# Patient Record
Sex: Male | Born: 1970 | Race: White | Hispanic: No | Marital: Married | Smoking: Former smoker
Health system: Southern US, Community
[De-identification: ages and names within clinical notes are randomized; demographics above are authoritative.]

## PROBLEM LIST (undated history)

## (undated) DIAGNOSIS — K219 Gastro-esophageal reflux disease without esophagitis: Secondary | ICD-10-CM

## (undated) DIAGNOSIS — N200 Calculus of kidney: Secondary | ICD-10-CM

## (undated) DIAGNOSIS — R7301 Impaired fasting glucose: Secondary | ICD-10-CM

## (undated) DIAGNOSIS — E66812 Obesity, class 2: Secondary | ICD-10-CM

## (undated) DIAGNOSIS — D485 Neoplasm of uncertain behavior of skin: Secondary | ICD-10-CM

## (undated) DIAGNOSIS — E669 Obesity, unspecified: Secondary | ICD-10-CM

## (undated) HISTORY — DX: Impaired fasting glucose: R73.01

## (undated) HISTORY — DX: Obesity, class 2: E66.812

## (undated) HISTORY — DX: Calculus of kidney: N20.0

## (undated) HISTORY — PX: TONSILLECTOMY: SHX5217

## (undated) HISTORY — DX: Gastro-esophageal reflux disease without esophagitis: K21.9

## (undated) HISTORY — DX: Obesity, unspecified: E66.9

## (undated) HISTORY — DX: Neoplasm of uncertain behavior of skin: D48.5

---

## 2007-02-09 ENCOUNTER — Ambulatory Visit: Payer: Self-pay | Admitting: Internal Medicine

## 2007-02-09 LAB — CONVERTED CEMR LAB
ALT: 36 units/L (ref 0–53)
AST: 21 units/L (ref 0–37)
Albumin: 4.2 g/dL (ref 3.5–5.2)
Alkaline Phosphatase: 46 units/L (ref 39–117)
BUN: 16 mg/dL (ref 6–23)
Basophils Absolute: 0.1 10*3/uL (ref 0.0–0.1)
Basophils Relative: 1.4 % — ABNORMAL HIGH (ref 0.0–1.0)
Bilirubin Urine: NEGATIVE
Bilirubin, Direct: 0.2 mg/dL (ref 0.0–0.3)
Blood in Urine, dipstick: NEGATIVE
CO2: 30 meq/L (ref 19–32)
Calcium: 9.2 mg/dL (ref 8.4–10.5)
Chloride: 104 meq/L (ref 96–112)
Cholesterol: 159 mg/dL (ref 0–200)
Creatinine, Ser: 0.8 mg/dL (ref 0.4–1.5)
Eosinophils Absolute: 0.3 10*3/uL (ref 0.0–0.6)
Eosinophils Relative: 4.5 % (ref 0.0–5.0)
GFR calc Af Amer: 139 mL/min
GFR calc non Af Amer: 115 mL/min
Glucose, Bld: 102 mg/dL — ABNORMAL HIGH (ref 70–99)
Glucose, Urine, Semiquant: NEGATIVE
HCT: 43.7 % (ref 39.0–52.0)
HDL: 51.6 mg/dL (ref >39.0)
Hemoglobin: 15.2 g/dL (ref 13.0–17.0)
Ketones, urine, test strip: NEGATIVE
LDL Cholesterol: 96 mg/dL (ref 0–99)
Lymphocytes Relative: 30.6 % (ref 12.0–46.0)
MCHC: 34.8 g/dL (ref 30.0–36.0)
MCV: 93 fL (ref 78.0–100.0)
Monocytes Absolute: 0.6 10*3/uL (ref 0.2–0.7)
Monocytes Relative: 7.8 % (ref 3.0–11.0)
Neutro Abs: 3.9 10*3/uL (ref 1.4–7.7)
Neutrophils Relative %: 55.7 % (ref 43.0–77.0)
Nitrite: NEGATIVE
Platelets: 271 10*3/uL (ref 150–400)
Potassium: 4.9 meq/L (ref 3.5–5.1)
Protein, U semiquant: NEGATIVE
RBC: 4.7 M/uL (ref 4.22–5.81)
RDW: 12.5 % (ref 11.5–14.6)
Sodium: 140 meq/L (ref 135–145)
Specific Gravity, Urine: 1.015
TSH: 4.52 microintl units/mL (ref 0.35–5.50)
Total Bilirubin: 0.8 mg/dL (ref 0.3–1.2)
Total CHOL/HDL Ratio: 3.1
Total Protein: 6.8 g/dL (ref 6.0–8.3)
Triglycerides: 59 mg/dL (ref 0–149)
Urobilinogen, UA: 0.2
VLDL: 12 mg/dL (ref 0–40)
WBC Urine, dipstick: NEGATIVE
WBC: 7.1 10*3/uL (ref 4.5–10.5)
pH: 6.5

## 2007-02-21 ENCOUNTER — Ambulatory Visit: Payer: Self-pay | Admitting: Internal Medicine

## 2007-03-30 ENCOUNTER — Ambulatory Visit: Payer: Self-pay | Admitting: Internal Medicine

## 2007-03-30 DIAGNOSIS — D485 Neoplasm of uncertain behavior of skin: Secondary | ICD-10-CM

## 2007-03-30 HISTORY — DX: Neoplasm of uncertain behavior of skin: D48.5

## 2007-04-06 ENCOUNTER — Telehealth: Payer: Self-pay | Admitting: Internal Medicine

## 2007-04-15 ENCOUNTER — Telehealth: Payer: Self-pay | Admitting: Internal Medicine

## 2008-05-03 ENCOUNTER — Ambulatory Visit: Payer: Self-pay | Admitting: Family Medicine

## 2008-05-03 DIAGNOSIS — R233 Spontaneous ecchymoses: Secondary | ICD-10-CM

## 2008-05-03 DIAGNOSIS — H1130 Conjunctival hemorrhage, unspecified eye: Secondary | ICD-10-CM

## 2008-10-16 ENCOUNTER — Telehealth: Payer: Self-pay | Admitting: Internal Medicine

## 2009-01-07 ENCOUNTER — Ambulatory Visit: Payer: Self-pay | Admitting: Internal Medicine

## 2009-01-07 LAB — CONVERTED CEMR LAB
ALT: 22 units/L (ref 0–53)
AST: 19 units/L (ref 0–37)
Albumin: 4.3 g/dL (ref 3.5–5.2)
Bilirubin Urine: NEGATIVE
Chloride: 107 meq/L (ref 96–112)
Cholesterol: 139 mg/dL (ref 0–200)
Eosinophils Relative: 3.5 % (ref 0.0–5.0)
GFR calc non Af Amer: 113.69 mL/min (ref >60)
Glucose, Bld: 109 mg/dL — ABNORMAL HIGH (ref 70–99)
Glucose, Urine, Semiquant: NEGATIVE
HCT: 41.8 % (ref 39.0–52.0)
Hemoglobin: 14.2 g/dL (ref 13.0–17.0)
Lymphs Abs: 1.7 10*3/uL (ref 0.7–4.0)
MCV: 96.7 fL (ref 78.0–100.0)
Monocytes Absolute: 0.5 10*3/uL (ref 0.1–1.0)
Monocytes Relative: 8.2 % (ref 3.0–12.0)
Neutro Abs: 3.3 10*3/uL (ref 1.4–7.7)
Potassium: 4.3 meq/L (ref 3.5–5.1)
Protein, U semiquant: NEGATIVE
RDW: 12.7 % (ref 11.5–14.6)
Sodium: 142 meq/L (ref 135–145)
TSH: 2.96 microintl units/mL (ref 0.35–5.50)
VLDL: 18.2 mg/dL (ref 0.0–40.0)
WBC Urine, dipstick: NEGATIVE
WBC: 5.7 10*3/uL (ref 4.5–10.5)
pH: 5.5

## 2009-01-14 ENCOUNTER — Ambulatory Visit: Payer: Self-pay | Admitting: Internal Medicine

## 2009-11-01 ENCOUNTER — Emergency Department (HOSPITAL_COMMUNITY): Admission: EM | Admit: 2009-11-01 | Discharge: 2009-11-01 | Payer: Self-pay | Admitting: Emergency Medicine

## 2009-11-02 ENCOUNTER — Emergency Department (HOSPITAL_COMMUNITY): Admission: EM | Admit: 2009-11-02 | Discharge: 2009-11-02 | Payer: Self-pay | Admitting: Emergency Medicine

## 2010-05-01 LAB — URINALYSIS, ROUTINE W REFLEX MICROSCOPIC
Bilirubin Urine: NEGATIVE
Nitrite: NEGATIVE
Specific Gravity, Urine: 1.031 — ABNORMAL HIGH (ref 1.005–1.030)
Urobilinogen, UA: 0.2 mg/dL (ref 0.0–1.0)

## 2010-05-01 LAB — DIFFERENTIAL
Basophils Absolute: 0 10*3/uL (ref 0.0–0.1)
Basophils Relative: 1 % (ref 0–1)
Neutro Abs: 4.3 10*3/uL (ref 1.7–7.7)
Neutrophils Relative %: 62 % (ref 43–77)

## 2010-05-01 LAB — POCT I-STAT, CHEM 8
Calcium, Ion: 1.19 mmol/L (ref 1.12–1.32)
Chloride: 104 mEq/L (ref 96–112)
Glucose, Bld: 124 mg/dL — ABNORMAL HIGH (ref 70–99)
HCT: 45 % (ref 39.0–52.0)

## 2010-05-01 LAB — CBC
MCHC: 34.8 g/dL (ref 30.0–36.0)
Platelets: 272 10*3/uL (ref 150–400)
RDW: 13.3 % (ref 11.5–15.5)

## 2010-05-01 LAB — URINE MICROSCOPIC-ADD ON

## 2010-05-13 ENCOUNTER — Emergency Department (HOSPITAL_BASED_OUTPATIENT_CLINIC_OR_DEPARTMENT_OTHER): Admission: EM | Admit: 2010-05-13 | Payer: Self-pay | Source: Home / Self Care

## 2011-02-17 DIAGNOSIS — K219 Gastro-esophageal reflux disease without esophagitis: Secondary | ICD-10-CM

## 2011-02-17 HISTORY — DX: Gastro-esophageal reflux disease without esophagitis: K21.9

## 2011-03-05 ENCOUNTER — Encounter: Payer: Self-pay | Admitting: Internal Medicine

## 2011-03-06 ENCOUNTER — Other Ambulatory Visit: Payer: Self-pay

## 2011-03-13 ENCOUNTER — Encounter: Payer: Self-pay | Admitting: Internal Medicine

## 2011-08-21 ENCOUNTER — Ambulatory Visit (INDEPENDENT_AMBULATORY_CARE_PROVIDER_SITE_OTHER): Payer: Managed Care, Other (non HMO) | Admitting: Family Medicine

## 2011-08-21 ENCOUNTER — Encounter: Payer: Self-pay | Admitting: Family Medicine

## 2011-08-21 VITALS — BP 131/87 | HR 61 | Temp 97.2°F | Ht 67.0 in | Wt 247.0 lb

## 2011-08-21 DIAGNOSIS — Z0000 Encounter for general adult medical examination without abnormal findings: Secondary | ICD-10-CM

## 2011-08-21 DIAGNOSIS — L909 Atrophic disorder of skin, unspecified: Secondary | ICD-10-CM

## 2011-08-21 DIAGNOSIS — L918 Other hypertrophic disorders of the skin: Secondary | ICD-10-CM

## 2011-08-21 DIAGNOSIS — L919 Hypertrophic disorder of the skin, unspecified: Secondary | ICD-10-CM

## 2011-08-21 LAB — COMPREHENSIVE METABOLIC PANEL
Albumin: 4.4 g/dL (ref 3.5–5.2)
CO2: 26 mEq/L (ref 19–32)
Chloride: 105 mEq/L (ref 96–112)
GFR: 130.94 mL/min (ref >60.00)
Glucose, Bld: 102 mg/dL — ABNORMAL HIGH (ref 70–99)
Potassium: 4.5 mEq/L (ref 3.5–5.1)
Sodium: 138 mEq/L (ref 135–145)
Total Protein: 7.2 g/dL (ref 6.0–8.3)

## 2011-08-21 LAB — CBC WITH DIFFERENTIAL/PLATELET
Basophils Absolute: 0 10*3/uL (ref 0.0–0.1)
Eosinophils Absolute: 0.1 10*3/uL (ref 0.0–0.7)
HCT: 43.2 % (ref 39.0–52.0)
Lymphs Abs: 1.3 10*3/uL (ref 0.7–4.0)
MCV: 94.2 fl (ref 78.0–100.0)
Monocytes Absolute: 0.4 10*3/uL (ref 0.1–1.0)
Monocytes Relative: 7.2 % (ref 3.0–12.0)
Platelets: 255 10*3/uL (ref 150.0–400.0)
RDW: 13.7 % (ref 11.5–14.6)

## 2011-08-21 LAB — TSH: TSH: 2.65 u[IU]/mL (ref 0.35–5.50)

## 2011-08-21 NOTE — Assessment & Plan Note (Signed)
Reviewed age and gender appropriate health maintenance issues (prudent diet, regular exercise, health risks of tobacco and excessive alcohol, use of seatbelts, fire alarms in home, use of sunscreen).  Also reviewed age and gender appropriate health screening as well as vaccine recommendations. Fasting labs done today.

## 2011-08-21 NOTE — Progress Notes (Signed)
Office Note 08/21/2011  CC:  Chief Complaint  Patient presents with  . Establish Care    CPE, fasting labs    HPI:  A[place] is a 41 y.o. White male who is here to transfer care from [facility], also get CPE with fasting labs. Patient's most recent primary MD: [facility] records in [facility] were reviewed prior to or during today's visit.  Says it is past time for CPE so he's here to get this done.  No complaints.   Past Medical History  Diagnosis Date  . 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 Obesity   . Impaired glucose tolerance   . Nephrolithiasis   . GERD (gastroesophageal reflux disease) 2013    Lanzoprazole helpful    Past Surgical History  Procedure Date  . Tonsillectomy     Family History  Problem Relation Age of Onset  . Diabetes Father     History   Social History  . Marital Status: Married    Spouse Name: N/A    Number of Children: N/A  . Years of Education: N/A   Occupational History  . Not on file.   Social History Main Topics  . Smoking status: Former Smoker    Types: Cigarettes    Quit date: 02/16/1986  . Smokeless tobacco: Current User    Types: Snuff, Chew  . Alcohol Use: Yes  . Drug Use: No  . Sexually Active: Not on file   Other Topics Concern  . 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  Occ alcohol intake.  No hx of drugs or alcohol problems.    Outpatient Encounter Prescriptions as of 08/21/2011  Medication Sig Dispense Refill  . lansoprazole (PREVACID SOLUTAB) 15 MG disintegrating tablet Take 15 mg by mouth daily.        No Known Allergies  ROS Review of Systems  Constitutional: Negative for fever, chills, appetite change and fatigue.  HENT:  Negative for ear pain, congestion, sore throat, neck stiffness and dental problem.   Eyes: Negative for discharge, redness and visual disturbance.  Respiratory: Negative for cough, chest tightness, shortness of breath and wheezing.   Cardiovascular: Negative for chest pain, palpitations and leg swelling.  Gastrointestinal: Negative for nausea, vomiting, abdominal pain, diarrhea and blood in stool.  Genitourinary: Negative for dysuria, urgency, frequency, hematuria, flank pain and difficulty urinating.  Musculoskeletal: Negative for myalgias, back pain, joint swelling and arthralgias.  Skin: Negative for pallor and rash.       Left shoulder/side of neck intersection--irritated skin tag that has been present x 30 yrs, has not bothered him until just recently after getting clothes snagged on it repeatedly.  Neurological: Negative for dizziness, speech difficulty, weakness and headaches.  Hematological: Negative for adenopathy. Does not bruise/bleed easily.  Psychiatric/Behavioral: Negative for confusion and disturbed wake/sleep cycle. The patient is not nervous/anxious.     PE; Blood pressure 131/87, pulse 61, temperature 97.2 F (36.2 C), temperature source Temporal, height 5\' 7"  (1.702 m), weight 247 lb (112.038 kg), SpO2 98.00%. Gen: Alert, well appearing.  Patient is oriented to person, place, time, and situation. Affect: pleasant, thought and speech are lucid. ENT: Ears: EACs clear, normal epithelium.  TMs with good light reflex and landmarks bilaterally.  Eyes:  no injection, icteris, swelling, or exudate.  EOMI, PERRLA. Nose: no drainage or turbinate edema/swelling.  No injection or focal lesion.  Mouth: lips without lesion/swelling.  Oral mucosa pink and moist.  Dentition intact and without obvious caries or gingival swelling.  Oropharynx without erythema, exudate, or swelling.  Neck: supple/nontender.  No LAD, mass, or TM.  Carotid pulses 2+ bilaterally, without bruits. CV: RRR, no m/r/g.    LUNGS: CTA bilat, nonlabored resps, good aeration in all lung fields. ABD: soft, NT, ND, BS normal.  No hepatospenomegaly or mass.  No bruits. EXT: no clubbing, cyanosis, or edema.  Skin - no sores or rashes or color changes.  He has a pedunculated 2-33mm flesh colored lesion at the intersection of left side of neck and left shoulder--appears c/w a skin tag. Musculoskeletal: no joint swelling, erythema, warmth, or tenderness.  ROM of all joints intact. Neuro: CN 2-12 intact bilaterally, strength 5/5 in proximal and distal upper extremities and lower extremities bilaterally.  No sensory deficits.  No tremor.  No disdiadochokinesis.  No ataxia.  Upper extremity and lower extremity DTRs symmetric.  No pronator drift. Genitals normal; both testes normal without tenderness, masses, hydroceles, varicoceles, erythema or swelling. Shaft normal, circumcised, meatus normal without discharge. No inguinal hernia noted. No inguinal lymphadenopathy.  Pertinent labs:  None today  Procedure: skin tag excision.  After prepping skin with betadine, I used a #10 scalpel and pickups to grasp and cut the skin tag at its base.  Minimal bleeding, pressure with gauze was sufficient to control this.  Dressing applied, wound care reviewed.  Pt tolerated procedure well, no immediate complications. Specimen sent to pathology.  ASSESSMENT AND PLAN:   Transfer patient:  Health maintenance examination Reviewed age and gender appropriate health maintenance issues (prudent diet, regular exercise, health risks of tobacco and excessive alcohol, use of seatbelts, fire alarms in home, use of sunscreen).  Also reviewed age and gender appropriate health screening as well as vaccine recommendations. Fasting labs done today.  Inflamed acrochordon Excised today, sent to pathology.    Return in about 1 year (around 08/20/2012) for CPE.

## 2011-08-21 NOTE — Assessment & Plan Note (Addendum)
Excised today, sent to pathology.

## 2011-08-21 NOTE — Patient Instructions (Signed)
Health Maintenance, Males A healthy lifestyle and preventative care can promote health and wellness.  Maintain regular health, dental, and eye exams.   Eat a healthy diet. Foods like vegetables, fruits, whole grains, low-fat dairy products, and lean protein foods contain the nutrients you need without too many calories. Decrease your intake of foods high in solid fats, added sugars, and salt. Get information about a proper diet from your caregiver, if necessary.   Regular physical exercise is one of the most important things you can do for your health. Most adults should get at least 150 minutes of moderate-intensity exercise (any activity that increases your heart rate and causes you to sweat) each week. In addition, most adults need muscle-strengthening exercises on 2 or more days a week.    Maintain a healthy weight. The body mass index (BMI) is a screening tool to identify possible weight problems. It provides an estimate of body fat based on height and weight. Your caregiver can help determine your BMI, and can help you achieve or maintain a healthy weight. For adults 20 years and older:   A BMI below 18.5 is considered underweight.   A BMI of 18.5 to 24.9 is normal.   A BMI of 25 to 29.9 is considered overweight.   A BMI of 30 and above is considered obese.   Maintain normal blood lipids and cholesterol by exercising and minimizing your intake of saturated fat. Eat a balanced diet with plenty of fruits and vegetables. Blood tests for lipids and cholesterol should begin at age 20 and be repeated every 5 years. If your lipid or cholesterol levels are high, you are over 50, or you are a high risk for heart disease, you may need your cholesterol levels checked more frequently.Ongoing high lipid and cholesterol levels should be treated with medicines, if diet and exercise are not effective.   If you smoke, find out from your caregiver how to quit. If you do not use tobacco, do not start.    If you choose to drink alcohol, do not exceed 2 drinks per day. One drink is considered to be 12 ounces (355 mL) of beer, 5 ounces (148 mL) of wine, or 1.5 ounces (44 mL) of liquor.   Avoid use of street drugs. Do not share needles with anyone. Ask for help if you need support or instructions about stopping the use of drugs.   High blood pressure causes heart disease and increases the risk of stroke. Blood pressure should be checked at least every 1 to 2 years. Ongoing high blood pressure should be treated with medicines if weight loss and exercise are not effective.   If you are 45 to 43 years old, ask your caregiver if you should take aspirin to prevent heart disease.   Diabetes screening involves taking a blood sample to check your fasting blood sugar level. This should be done once every 3 years, after age 45, if you are within normal weight and without risk factors for diabetes. Testing should be considered at a younger age or be carried out more frequently if you are overweight and have at least 1 risk factor for diabetes.   Colorectal cancer can be detected and often prevented. Most routine colorectal cancer screening begins at the age of 50 and continues through age 75. However, your caregiver may recommend screening at an earlier age if you have risk factors for colon cancer. On a yearly basis, your caregiver may provide home test kits to check for hidden   blood in the stool. Use of a small camera at the end of a tube, to directly examine the colon (sigmoidoscopy or colonoscopy), can detect the earliest forms of colorectal cancer. Talk to your caregiver about this at age 50, when routine screening begins. Direct examination of the colon should be repeated every 5 to 10 years through age 75, unless early forms of pre-cancerous polyps or small growths are found.   Hepatitis C blood testing is recommended for all people born from 1945 through 1965 and any individual with known risks for  hepatitis C.   Healthy men should no longer receive prostate-specific antigen (PSA) blood tests as part of routine cancer screening. Consult with your caregiver about prostate cancer screening.   Testicular cancer screening is not recommended for adolescents or adult males who have no symptoms. Screening includes self-exam, caregiver exam, and other screening tests. Consult with your caregiver about any symptoms you have or any concerns you have about testicular cancer.   Practice safe sex. Use condoms and avoid high-risk sexual practices to reduce the spread of sexually transmitted infections (STIs).   Use sunscreen with a sun protection factor (SPF) of 30 or greater. Apply sunscreen liberally and repeatedly throughout the day. You should seek shade when your shadow is shorter than you. Protect yourself by wearing long sleeves, pants, a wide-brimmed hat, and sunglasses year round, whenever you are outdoors.   Notify your caregiver of new moles or changes in moles, especially if there is a change in shape or color. Also notify your caregiver if a mole is larger than the size of a pencil eraser.   A one-time screening for abdominal aortic aneurysm (AAA) and surgical repair of large AAAs by sound wave imaging (ultrasonography) is recommended for ages 65 to 75 years who are current or former smokers.   Stay current with your immunizations.  Document Released: 08/01/2007 Document Revised: 01/22/2011 Document Reviewed: 06/30/2010 [employer] Patient Information 2012 [employer]. 

## 2011-08-24 ENCOUNTER — Other Ambulatory Visit (HOSPITAL_COMMUNITY)
Admission: RE | Admit: 2011-08-24 | Discharge: 2011-08-24 | Disposition: A | Discharge status: Home / Self Care | Payer: Managed Care, Other (non HMO) | Source: Ambulatory Visit | Attending: Family Medicine | Admitting: Family Medicine

## 2011-08-24 DIAGNOSIS — L909 Atrophic disorder of skin, unspecified: Secondary | ICD-10-CM | POA: Insufficient documentation

## 2011-08-24 NOTE — Addendum Note (Signed)
Addended by: Luisa Dago on: 08/24/2011 08:40 AM   Modules accepted: Orders

## 2012-01-11 IMAGING — CT CT ABD-PELV W/O CM
2 of 4 series · 17 of 45 positions shown, 19 images · non-contrast
Comparison: None

CLINICAL DATA: Left flank pain, nausea, microscopic hematuria

CT ABDOMEN AND PELVIS WITHOUT CONTRAST
TECHNIQUE: Multidetector CT imaging of the abdomen and pelvis was
performed following the standard protocol without intravenous
contrast.

[Series 5: lung windows · axial · 0.74mm/px · z∈[+1203,+1283]mm · 14 of 19 slices shown, 16 images]
[im 2/19  soft-tissue]
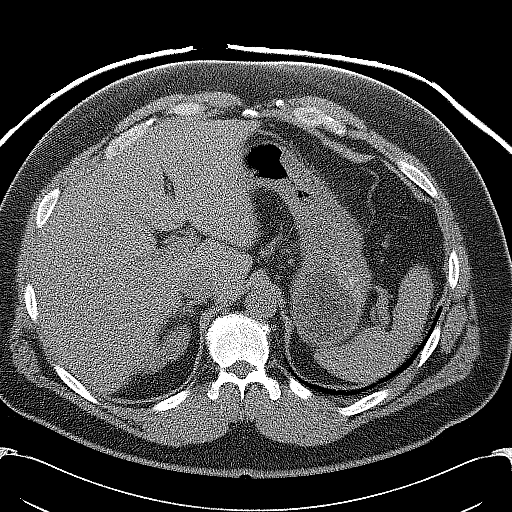
[im 2/19  bone]
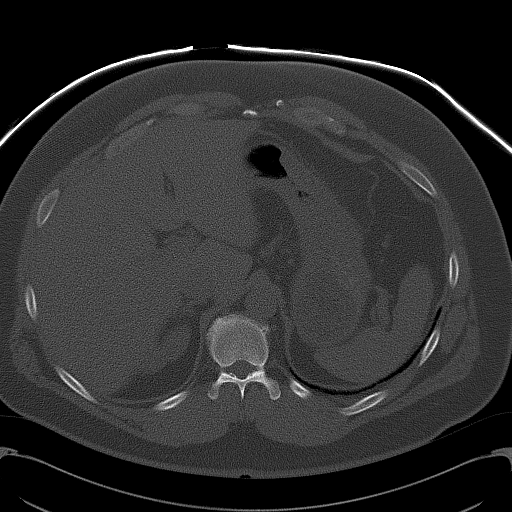
[im 3/19  soft-tissue]
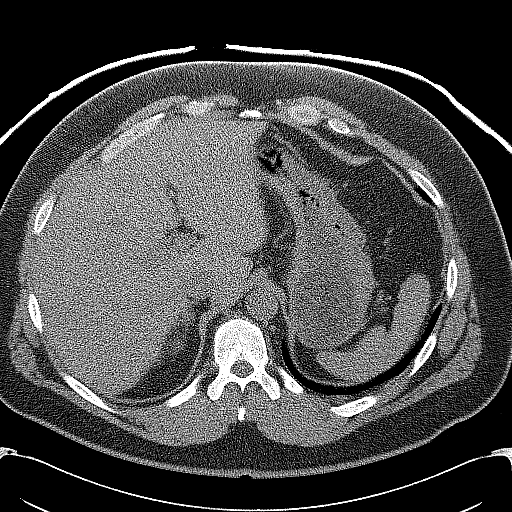
[im 5/19  soft-tissue]
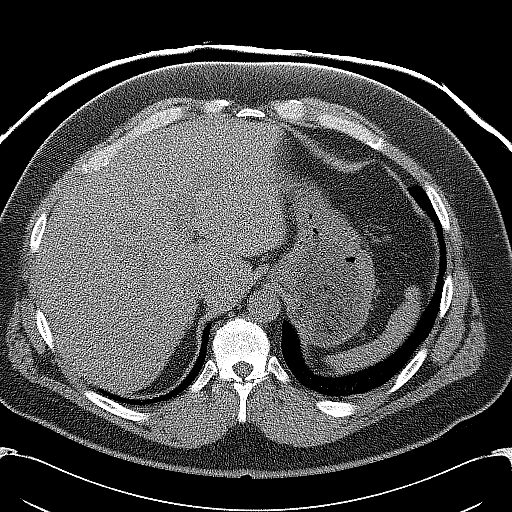
[im 6/19  soft-tissue]
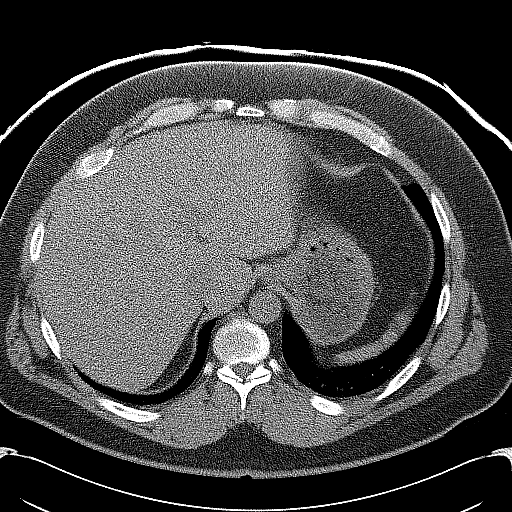
[im 7/19  soft-tissue]
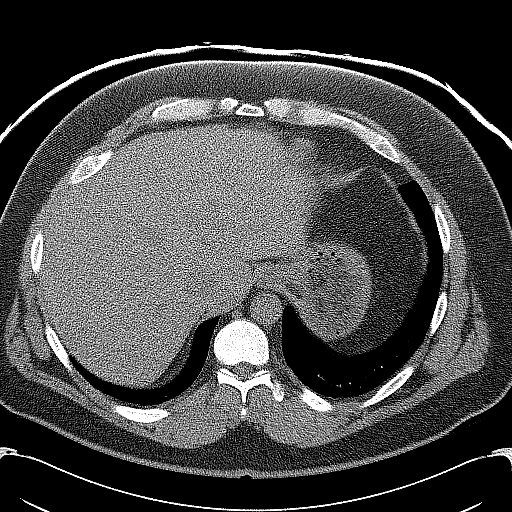
[im 8/19  soft-tissue]
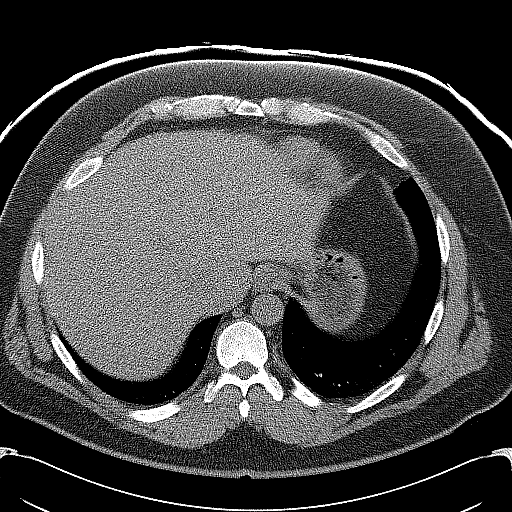
[im 9/19  soft-tissue]
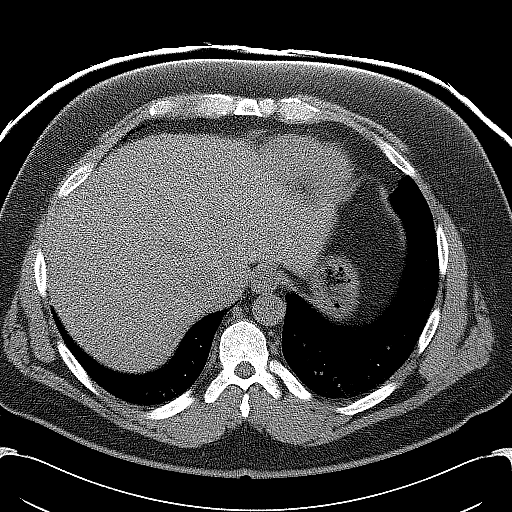
[im 11/19  soft-tissue]
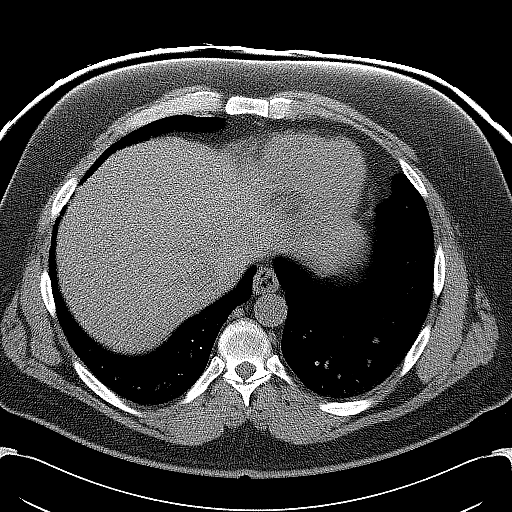
[im 12/19  soft-tissue]
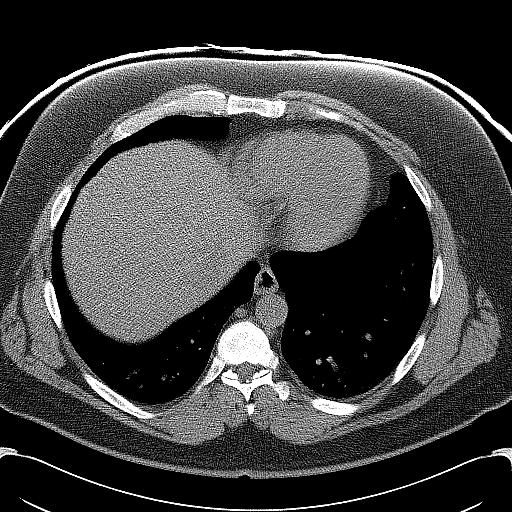
[im 12/19  bone]
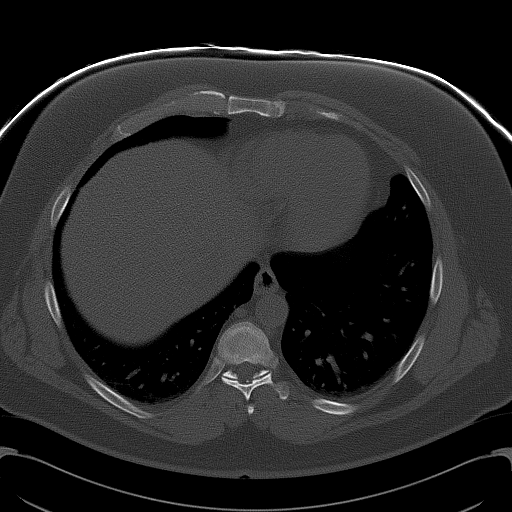
[im 13/19  soft-tissue]
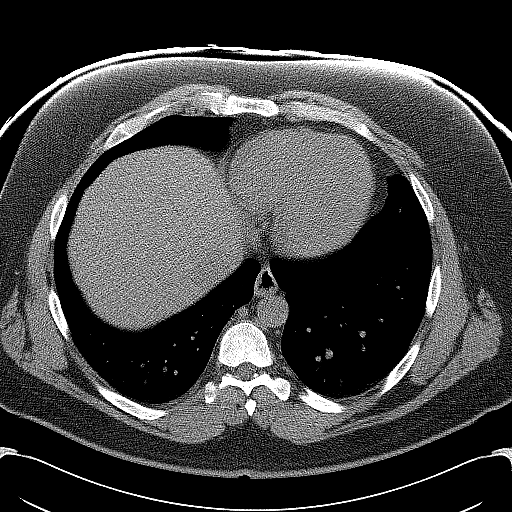
[im 14/19  soft-tissue]
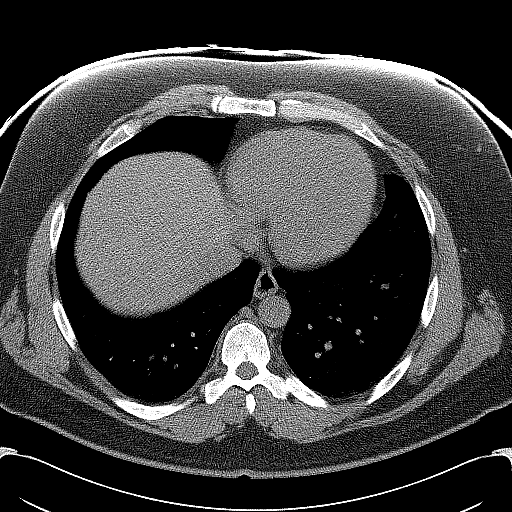
[im 15/19  soft-tissue]
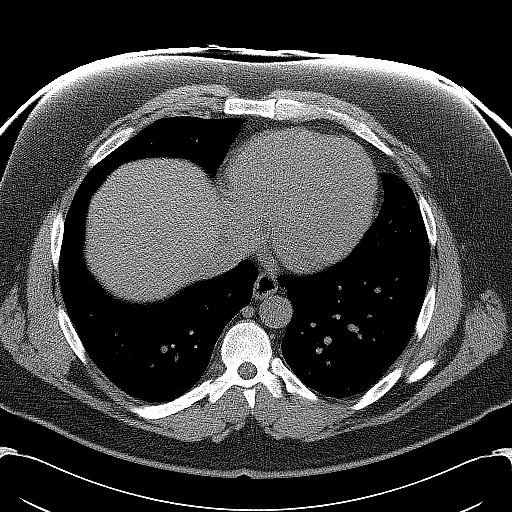
[im 17/19  soft-tissue]
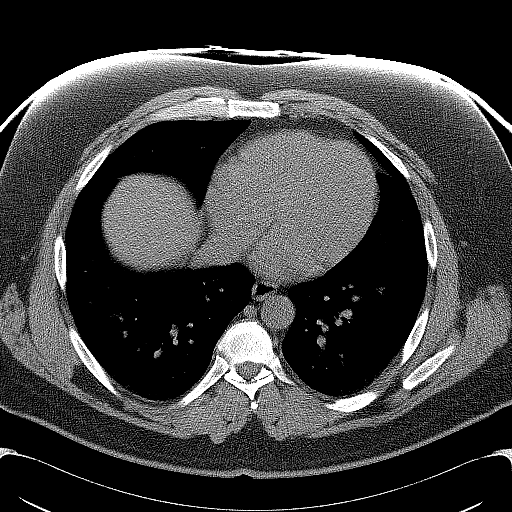
[im 18/19  soft-tissue]
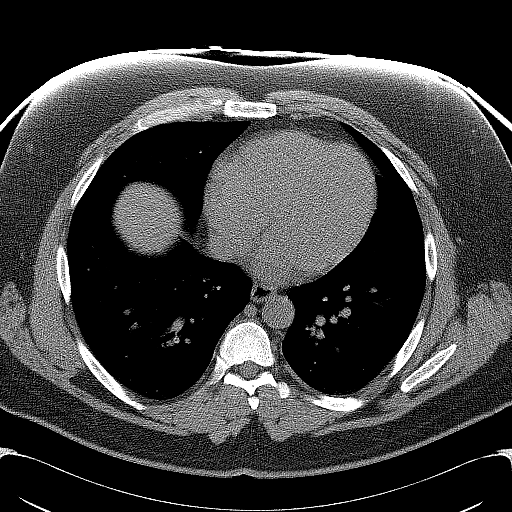

[Series 602: <mpr thick range> · coronal · 0.94mm/px · 3 of 84 slices shown]
[im 28/84  soft-tissue]
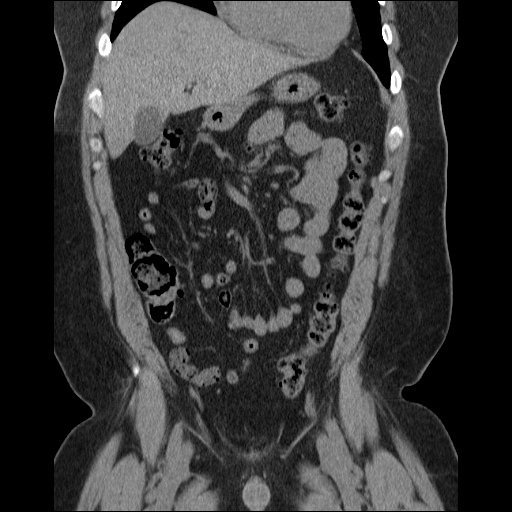
[im 37/84  soft-tissue]
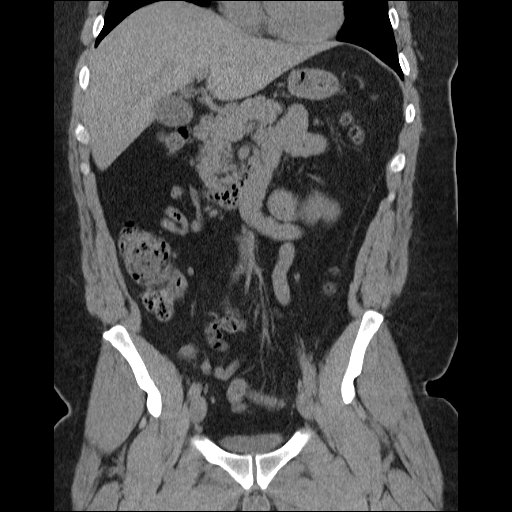
[im 47/84  soft-tissue]
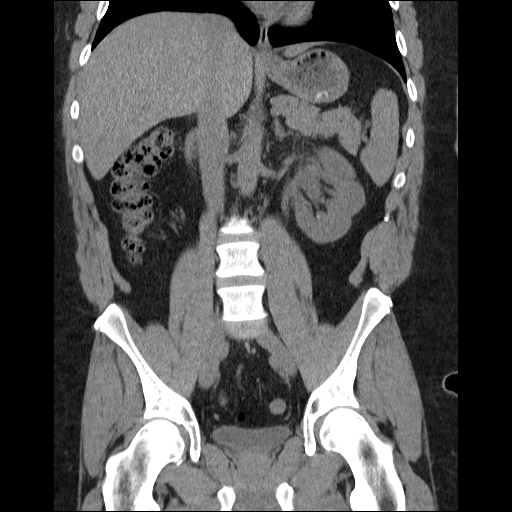

[17 of 45 positions shown; findings below may reference images not displayed]

FINDINGS: Dependent atelectasis at lung bases.
Left hydronephrosis and mild enlargement of left kidney secondary
to a proximal left ureteral calculus 4 mm diameter image 45.
No additional urinary tract calcification or distal ureteral
dilatation.
Liver, spleen, pancreas, right kidney, and adrenal glands
unremarkable.
Normal appendix.
Stomach and bowel loops unremarkable.
No mass, adenopathy, free fluid or inflammatory process.
No acute osseous findings.
IMPRESSION: Left hydronephrosis secondary to a 4 mm diameter proximal left
ureteral calculus.

## 2012-04-02 ENCOUNTER — Other Ambulatory Visit: Payer: Self-pay

## 2012-12-22 ENCOUNTER — Other Ambulatory Visit: Payer: Self-pay

## 2013-02-06 ENCOUNTER — Ambulatory Visit (INDEPENDENT_AMBULATORY_CARE_PROVIDER_SITE_OTHER): Payer: Managed Care, Other (non HMO) | Admitting: Family Medicine

## 2013-02-06 ENCOUNTER — Encounter: Payer: Self-pay | Admitting: Family Medicine

## 2013-02-06 VITALS — BP 121/80 | HR 57 | Temp 98.4°F | Ht 67.0 in | Wt 244.8 lb

## 2013-02-06 DIAGNOSIS — Z0000 Encounter for general adult medical examination without abnormal findings: Secondary | ICD-10-CM

## 2013-02-06 LAB — CBC WITH DIFFERENTIAL/PLATELET
Basophils Relative: 0.5 % (ref 0.0–3.0)
Eosinophils Absolute: 0.1 10*3/uL (ref 0.0–0.7)
Eosinophils Relative: 2.9 % (ref 0.0–5.0)
Hemoglobin: 14 g/dL (ref 13.0–17.0)
Lymphocytes Relative: 28.6 % (ref 12.0–46.0)
MCHC: 33.3 g/dL (ref 30.0–36.0)
MCV: 93 fl (ref 78.0–100.0)
Monocytes Absolute: 0.4 10*3/uL (ref 0.1–1.0)
Neutro Abs: 3.2 10*3/uL (ref 1.4–7.7)
RBC: 4.51 Mil/uL (ref 4.22–5.81)
WBC: 5.2 10*3/uL (ref 4.5–10.5)

## 2013-02-06 LAB — COMPREHENSIVE METABOLIC PANEL
Albumin: 4.4 g/dL (ref 3.5–5.2)
Alkaline Phosphatase: 36 U/L — ABNORMAL LOW (ref 39–117)
BUN: 19 mg/dL (ref 6–23)
CO2: 27 mEq/L (ref 19–32)
Calcium: 8.8 mg/dL (ref 8.4–10.5)
GFR: 146.87 mL/min (ref >60.00)
Glucose, Bld: 101 mg/dL — ABNORMAL HIGH (ref 70–99)
Potassium: 4.8 mEq/L (ref 3.5–5.1)

## 2013-02-06 LAB — LIPID PANEL: Cholesterol: 136 mg/dL (ref 0–200)

## 2013-02-06 NOTE — Progress Notes (Signed)
Pre-visit discussion using our clinic review tool. No additional management support is needed unless otherwise documented below in the visit note.  

## 2013-02-06 NOTE — Progress Notes (Signed)
Office Note 02/06/2013  CC:  Chief Complaint  Patient presents with  . Annual Exam    HPI:  Aaron [place] is a 42 y.o. White male who is here for annual CPE. No acute complaints. He is walking 4 mi/day for the last 36mo as his exercise.  No specific dietary restrictions at this time.   Past Medical History  Diagnosis Date  . 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 Obesity   . Impaired glucose tolerance   . Nephrolithiasis   . GERD (gastroesophageal reflux disease) 2013    Lanzoprazole helpful    Past Surgical History  Procedure Laterality Date  . Tonsillectomy      Family History  Problem Relation Age of Onset  . Diabetes Father     History   Social History  . Marital Status: Married    Spouse Name: N/A    Number of Children: N/A  . Years of Education: N/A   Occupational History  . Not on file.   Social History Main Topics  . Smoking status: Former Smoker    Types: Cigarettes    Quit date: 02/16/1986  . Smokeless tobacco: Current User    Types: Snuff, Chew  . Alcohol Use: Yes  . Drug Use: No  . Sexual Activity: Not on file   Other Topics Concern  . Not on file   Social History Narrative   Married, one son and one daughter (boy 7, girl 9).   Orig from [place].   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   No exercise but not sedentary.   Quit smoking 25 yrs ago.   +chews tobacco.  Occ alcohol intake.  No hx of drugs or alcohol problems.          Outpatient Prescriptions Prior to Visit  Medication Sig Dispense Refill  . lansoprazole (PREVACID SOLUTAB) 15 MG disintegrating tablet Take 15 mg by mouth daily.       No facility-administered medications prior to visit.    No Known Allergies  ROS Review of Systems  Constitutional: Negative for fever, chills, appetite change and fatigue.  HENT: Negative for congestion, dental problem, ear pain and sore throat.    Eyes: Negative for discharge, redness and visual disturbance.  Respiratory: Negative for cough, chest tightness, shortness of breath and wheezing.   Cardiovascular: Negative for chest pain, palpitations and leg swelling.  Gastrointestinal: Negative for nausea, vomiting, abdominal pain, diarrhea and blood in stool.  Genitourinary: Negative for dysuria, urgency, frequency, hematuria, flank pain and difficulty urinating.  Musculoskeletal: Negative for arthralgias, back pain, joint swelling, myalgias and neck stiffness.  Skin: Negative for pallor and rash.  Neurological: Negative for dizziness, speech difficulty, weakness and headaches.  Hematological: Negative for adenopathy. Does not bruise/bleed easily.  Psychiatric/Behavioral: Negative for confusion and sleep disturbance. The patient is not nervous/anxious.      PE; Blood pressure 121/80, pulse 57, temperature 98.4 F (36.9 C), temperature source Temporal, height 5\' 7"  (1.702 m), weight 244 lb 12 oz (111.018 kg), SpO2 100.00%. Gen: Alert, well appearing, obese appearing WM.  Patient is oriented to person, place, time, and situation. AFFECT: pleasant, lucid thought and speech. ENT: Ears: EACs clear, normal epithelium.  TMs with good light reflex and landmarks bilaterally.  Eyes: no injection, icteris, swelling, or exudate.  EOMI, PERRLA. Nose: no drainage or turbinate edema/swelling.  No injection or focal lesion.  Mouth: lips without lesion/swelling.  Oral  mucosa pink and moist.  Dentition intact and without obvious caries or gingival swelling.  Oropharynx without erythema, exudate, or swelling.  Neck: supple/nontender.  No LAD, mass, or TM.  Carotid pulses 2+ bilaterally, without bruits. CV: RRR, no m/r/g.   LUNGS: CTA bilat, nonlabored resps, good aeration in all lung fields. ABD: soft, NT, ND, BS normal.  No hepatospenomegaly or mass.  No bruits. EXT: no clubbing, cyanosis, or edema.  Musculoskeletal: no joint swelling, erythema,  warmth, or tenderness.  ROM of all joints intact. Skin - no sores or suspicious lesions or rashes or color changes.  Left upper back region with approx 3 cm soft, nontender epidermal inclusion cyst.  No overlying skin changes or erythema.  Pertinent labs:  none  ASSESSMENT AND PLAN:   Health maintenance examination Reviewed age and gender appropriate health maintenance issues (prudent diet, regular exercise, health risks of tobacco and excessive alcohol, use of seatbelts, fire alarms in home, use of sunscreen).  Also reviewed age and gender appropriate health screening as well as vaccine recommendations. Health panel ordered-fasting.   An After Visit Summary was printed and given to the patient.  FOLLOW UP:  Return in about 1 year (around 02/06/2014) for CPE with fasting labs the week prior.

## 2013-02-06 NOTE — Assessment & Plan Note (Signed)
Reviewed age and gender appropriate health maintenance issues (prudent diet, regular exercise, health risks of tobacco and excessive alcohol, use of seatbelts, fire alarms in home, use of sunscreen).  Also reviewed age and gender appropriate health screening as well as vaccine recommendations. Health panel ordered-fasting.

## 2014-02-08 ENCOUNTER — Encounter: Payer: Self-pay | Admitting: Family Medicine

## 2014-02-08 ENCOUNTER — Ambulatory Visit (INDEPENDENT_AMBULATORY_CARE_PROVIDER_SITE_OTHER): Payer: Managed Care, Other (non HMO) | Admitting: Family Medicine

## 2014-02-08 VITALS — BP 133/83 | HR 74 | Temp 97.8°F | Ht 67.0 in | Wt 244.0 lb

## 2014-02-08 DIAGNOSIS — L720 Epidermal cyst: Secondary | ICD-10-CM

## 2014-02-08 DIAGNOSIS — R7301 Impaired fasting glucose: Secondary | ICD-10-CM

## 2014-02-08 DIAGNOSIS — R7302 Impaired glucose tolerance (oral): Secondary | ICD-10-CM

## 2014-02-08 DIAGNOSIS — Z0000 Encounter for general adult medical examination without abnormal findings: Secondary | ICD-10-CM

## 2014-02-08 DIAGNOSIS — R7309 Other abnormal glucose: Secondary | ICD-10-CM

## 2014-02-08 LAB — CBC WITH DIFFERENTIAL/PLATELET
BASOS ABS: 0 10*3/uL (ref 0.0–0.1)
Basophils Relative: 0.3 % (ref 0.0–3.0)
EOS ABS: 0.1 10*3/uL (ref 0.0–0.7)
Eosinophils Relative: 2.2 % (ref 0.0–5.0)
HEMATOCRIT: 44.3 % (ref 39.0–52.0)
HEMOGLOBIN: 14.7 g/dL (ref 13.0–17.0)
LYMPHS ABS: 1.8 10*3/uL (ref 0.7–4.0)
Lymphocytes Relative: 31.8 % (ref 12.0–46.0)
MCHC: 33.3 g/dL (ref 30.0–36.0)
MCV: 93.3 fl (ref 78.0–100.0)
MONO ABS: 0.4 10*3/uL (ref 0.1–1.0)
Monocytes Relative: 7 % (ref 3.0–12.0)
NEUTROS ABS: 3.3 10*3/uL (ref 1.4–7.7)
Neutrophils Relative %: 58.7 % (ref 43.0–77.0)
Platelets: 268 10*3/uL (ref 150.0–400.0)
RBC: 4.75 Mil/uL (ref 4.22–5.81)
RDW: 13.2 % (ref 11.5–15.5)
WBC: 5.6 10*3/uL (ref 4.0–10.5)

## 2014-02-08 LAB — COMPREHENSIVE METABOLIC PANEL
ALBUMIN: 4.5 g/dL (ref 3.5–5.2)
ALK PHOS: 43 U/L (ref 39–117)
ALT: 23 U/L (ref 0–53)
AST: 17 U/L (ref 0–37)
BUN: 14 mg/dL (ref 6–23)
CO2: 25 mEq/L (ref 19–32)
Calcium: 9.1 mg/dL (ref 8.4–10.5)
Chloride: 106 mEq/L (ref 96–112)
Creatinine, Ser: 0.8 mg/dL (ref 0.4–1.5)
GFR: 119.55 mL/min (ref >60.00)
Glucose, Bld: 95 mg/dL (ref 70–99)
POTASSIUM: 5 meq/L (ref 3.5–5.1)
SODIUM: 139 meq/L (ref 135–145)
TOTAL PROTEIN: 7.1 g/dL (ref 6.0–8.3)
Total Bilirubin: 0.8 mg/dL (ref 0.2–1.2)

## 2014-02-08 LAB — LIPID PANEL
Cholesterol: 160 mg/dL (ref 0–200)
HDL: 52.6 mg/dL (ref >39.00)
LDL Cholesterol: 93 mg/dL (ref 0–99)
NONHDL: 107.4
Total CHOL/HDL Ratio: 3
Triglycerides: 71 mg/dL (ref 0.0–149.0)
VLDL: 14.2 mg/dL (ref 0.0–40.0)

## 2014-02-08 LAB — TSH: TSH: 2.89 u[IU]/mL (ref 0.35–4.50)

## 2014-02-08 LAB — HEMOGLOBIN A1C: HEMOGLOBIN A1C: 5.9 % (ref 4.6–6.5)

## 2014-02-08 NOTE — Progress Notes (Signed)
Pre visit review using our clinic review tool, if applicable. No additional management support is needed unless otherwise documented below in the visit note. 

## 2014-02-08 NOTE — Assessment & Plan Note (Signed)
Repeating fasting glucose today, plus I added HbA1c this time. Recommended pt work harder on TLC.

## 2014-02-08 NOTE — Assessment & Plan Note (Signed)
He wants to make appt in near future to return and have this excised. It appears non-inflamed/non-infected.

## 2014-02-08 NOTE — Assessment & Plan Note (Signed)
Reviewed age and gender appropriate health maintenance issues (prudent diet, regular exercise, health risks of tobacco and excessive alcohol, use of seatbelts, fire alarms in home, use of sunscreen).  Also reviewed age and gender appropriate health screening as well as vaccine recommendations. HP labs today. Pt UTD on vaccines. Encouraged TLC.

## 2014-02-08 NOTE — Progress Notes (Signed)
Office Note 02/08/2014  CC:  Chief Complaint  Patient presents with  . Annual Exam   HPI:  Aaron Russell is a 43 y.o. White male who is here for annual CPE, fasting. No problems over the last year. Not exercising now but walks 1 mile daily in the morning in summer/fall. Pays no attention to diet.   Past Medical History  Diagnosis Date  . NEOPLASM, SKIN, UNCERTAIN BEHAVIOR 0/62/3762    Hemangioma  . Obesity   . Impaired glucose tolerance   . Nephrolithiasis   . GERD (gastroesophageal reflux disease) 2013    Lanzoprazole helpful    Past Surgical History  Procedure Laterality Date  . Tonsillectomy      Family History  Problem Relation Age of Onset  . Diabetes Father     History   Social History  . Marital Status: Married    Spouse Name: N/A    Number of Children: N/A  . Years of Education: N/A   Occupational History  . Not on file.   Social History Main Topics  . Smoking status: Former Smoker    Types: Cigarettes    Quit date: 02/16/1986  . Smokeless tobacco: Current User    Types: Snuff, Chew  . Alcohol Use: Yes  . Drug Use: No  . Sexual Activity: Not on file   Other Topics Concern  . Not on file   Social History Narrative   Married, one son and one daughter.   Orig from [place].   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   No exercise but not sedentary.   Quit smoking 25 yrs ago.   +chews tobacco.  Occ alcohol intake.  No hx of drugs or alcohol problems.          Outpatient Prescriptions Prior to Visit  Medication Sig Dispense Refill  . lansoprazole (PREVACID SOLUTAB) 15 MG disintegrating tablet Take 15 mg by mouth daily.     No facility-administered medications prior to visit.    No Known Allergies  ROS Review of Systems  Constitutional: Negative for fever, chills, activity change, appetite change and fatigue.  HENT: Negative for congestion, dental problem, ear pain,  mouth sores, sore throat and trouble swallowing.   Eyes: Negative for pain, discharge, redness and visual disturbance.  Respiratory: Negative for cough, chest tightness, shortness of breath and wheezing.   Cardiovascular: Negative for chest pain, palpitations and leg swelling.  Gastrointestinal: Negative for nausea, vomiting, abdominal pain, diarrhea and blood in stool.  Genitourinary: Negative for dysuria, urgency, frequency, hematuria, flank pain and difficulty urinating.  Musculoskeletal: Negative for myalgias, back pain, joint swelling, arthralgias and neck stiffness.  Skin: Negative for color change, pallor and rash.  Neurological: Negative for dizziness, speech difficulty, weakness and headaches.  Hematological: Negative for adenopathy. Does not bruise/bleed easily.  Psychiatric/Behavioral: Negative for confusion and sleep disturbance. The patient is not nervous/anxious.     PE; Blood pressure 133/83, pulse 74, temperature 97.8 F (36.6 C), temperature source Temporal, height 5\' 7"  (1.702 m), weight 244 lb (110.678 kg), SpO2 96 %. Gen: Alert, well appearing.  Patient is oriented to person, place, time, and situation. AFFECT: pleasant, lucid thought and speech. ENT: Ears: EACs clear, normal epithelium.  TMs with good light reflex and landmarks bilaterally.  Eyes: no injection, icteris, swelling, or exudate.  EOMI, PERRLA. Nose: no drainage or turbinate edema/swelling.  No injection or focal lesion.  Mouth: lips without lesion/swelling.  Oral mucosa  pink and moist.  Dentition intact and without obvious caries or gingival swelling.  Oropharynx without erythema, exudate, or swelling.  Neck: supple/nontender.  No LAD, mass, or TM.  Carotid pulses 2+ bilaterally, without bruits. CV: RRR, no m/r/g.   LUNGS: CTA bilat, nonlabored resps, good aeration in all lung fields. ABD: soft, NT, ND, BS normal.  No hepatospenomegaly or mass.  No bruits. EXT: no clubbing, cyanosis, or edema.   Musculoskeletal: no joint swelling, erythema, warmth, or tenderness.  ROM of all joints intact. Skin - no sores or suspicious lesions or rashes or color changes. On his back, just medial to his left scapula he has 2 epidermal inclusion cysts: one large (about 5 cm diameter) and one small (about 1-2 cm diameter).  No erythema, tenderness, or induration.   Pertinent labs:  None today  ASSESSMENT AND PLAN:   Health maintenance examination Reviewed age and gender appropriate health maintenance issues (prudent diet, regular exercise, health risks of tobacco and excessive alcohol, use of seatbelts, fire alarms in home, use of sunscreen).  Also reviewed age and gender appropriate health screening as well as vaccine recommendations. HP labs today. Pt UTD on vaccines. Encouraged TLC.  Epidermal inclusion cyst He wants to make appt in near future to return and have this excised. It appears non-inflamed/non-infected.  Impaired fasting glucose Repeating fasting glucose today, plus I added HbA1c this time. Recommended pt work harder on TLC.  An After Visit Summary was printed and given to the patient.  FOLLOW UP:  Return in about 1 year (around 02/09/2015) for annual CPE (fasting): also, make appt for 30 min procedure visit at your convenience for back cyst.

## 2014-02-13 ENCOUNTER — Encounter: Payer: Managed Care, Other (non HMO) | Admitting: Family Medicine

## 2014-03-09 ENCOUNTER — Ambulatory Visit: Payer: Managed Care, Other (non HMO) | Admitting: Family Medicine

## 2014-03-21 ENCOUNTER — Encounter: Payer: Self-pay | Admitting: Family Medicine

## 2014-03-21 ENCOUNTER — Ambulatory Visit (INDEPENDENT_AMBULATORY_CARE_PROVIDER_SITE_OTHER): Payer: Managed Care, Other (non HMO) | Admitting: Family Medicine

## 2014-03-21 VITALS — BP 130/86 | HR 94 | Temp 97.3°F | Ht 67.0 in | Wt 251.0 lb

## 2014-03-21 DIAGNOSIS — L720 Epidermal cyst: Secondary | ICD-10-CM

## 2014-03-21 NOTE — Progress Notes (Signed)
Pre visit review using our clinic review tool, if applicable. No additional management support is needed unless otherwise documented below in the visit note. 

## 2014-03-21 NOTE — Addendum Note (Signed)
Addended by: Tammi Sou on: 03/21/2014 04:58 PM   Modules accepted: Orders

## 2014-03-21 NOTE — Progress Notes (Signed)
OFFICE NOTE  03/21/2014  CC: Cyst  HPI: Patient is a 44 y.o. Caucasian male who is here for cyst I& D.   Two cysts on back for at least a couple years, no pain or recent enlargement.  He is simply tired of having them.  Pertinent PMH:  Past medical, surgical, social, and family history reviewed and no changes are noted since last office visit.  MEDS:  Outpatient Prescriptions Prior to Visit  Medication Sig Dispense Refill  . lansoprazole (PREVACID SOLUTAB) 15 MG disintegrating tablet Take 15 mg by mouth daily.     No facility-administered medications prior to visit.    PE: Blood pressure 130/86, pulse 94, temperature 97.3 F (36.3 C), temperature source Temporal, height 5\' 7"  (1.702 m), weight 251 lb (113.853 kg), SpO2 97 %. BACK: to left of midline on upper back there is a 6 cm diameter round subQ cystic mass that is nontender, mildly fluctuant. Similar lesion a bit higher up and to the right of midline but that lesion is only 2cm diameter oval.  IMPRESSION AND PLAN:   Epidermal inclusion cyst x 2, no sign of infection.  Procedure: Incision and drainage of cyst x 2.  The indication for the procedure was explained to the patient, benefits and risks of procedure were outlined for patient, patient agreed to proceed.  Steps of the procedure were clearly explained to the patient prior to starting. Injected large lesion with 3.5 ml of 2% lidocaine with epi and smaller lesion with 1.43ml of same solution for local anesthesia.  Used 4 mm punch biopsy instrument to take core out of center of each cystic lesion.  I then used manual pressure and hemostats to express contents and encourage complete drainage.  Culture swab of lesion contents obtained and sent to lab. I was able to removed the capsule from the large cystic lesion after contents were expressed, so I then closed the small skin defect with one 4-0 ethilon suture.  I could not get the capsule from the smaller cyst extracted so I left  this wound open.  It was shallow enough that I did not feel packing was necessary.   Wound dressed.  No bleeding.  Patient tolerated procedure well.  No immediate complications.  Wound care instructions given.  Warning signs of infection discussed. Follow up discussed.  Call or return for problems.  FOLLOW UP: 1 wk recheck wound and remove suture.

## 2014-03-24 LAB — WOUND CULTURE: Gram Stain: NONE SEEN

## 2014-03-28 ENCOUNTER — Encounter: Payer: Self-pay | Admitting: Family Medicine

## 2014-03-28 ENCOUNTER — Ambulatory Visit (INDEPENDENT_AMBULATORY_CARE_PROVIDER_SITE_OTHER): Payer: Managed Care, Other (non HMO) | Admitting: Family Medicine

## 2014-03-28 VITALS — BP 135/88 | HR 70 | Temp 98.0°F | Ht 67.0 in | Wt 252.0 lb

## 2014-03-28 DIAGNOSIS — L720 Epidermal cyst: Secondary | ICD-10-CM

## 2014-03-28 NOTE — Progress Notes (Signed)
Pre visit review using our clinic review tool, if applicable. No additional management support is needed unless otherwise documented below in the visit note. 

## 2014-03-28 NOTE — Progress Notes (Signed)
44 y/o WM returns for 7d f/u recent I&D of a small cyst on back + cystectomy of larger cyst also on back.  No complaints.   No swelling, redness, or drainage from the wounds.  No fever or malaise. Exam: VS normal. Small scab where smaller cyst was, no erythema or swelling or nodularity or fluctuance.  No tenderness. Suture intact where larger cyst was removed.  No erythema, swelling, fluctuance, or tenderness. I removed the suture w/out problem today. A/P:  Cystectomy x 1 plus I&D of smaller cyst--both on the back. Healing appropriately, w/out sign of recurrence or infection.  Suture removed. Signs/symptoms to call or return for were reviewed and pt expressed understanding.  An After Visit Summary was printed and given to the patient.  Follow up prn.

## 2015-02-12 ENCOUNTER — Encounter: Payer: Managed Care, Other (non HMO) | Admitting: Family Medicine

## 2015-02-13 ENCOUNTER — Ambulatory Visit (INDEPENDENT_AMBULATORY_CARE_PROVIDER_SITE_OTHER): Payer: Managed Care, Other (non HMO) | Admitting: Family Medicine

## 2015-02-13 ENCOUNTER — Encounter: Payer: Self-pay | Admitting: Family Medicine

## 2015-02-13 VITALS — BP 119/78 | HR 63 | Temp 98.1°F | Resp 16 | Ht 67.75 in | Wt 241.2 lb

## 2015-02-13 DIAGNOSIS — Z0000 Encounter for general adult medical examination without abnormal findings: Secondary | ICD-10-CM

## 2015-02-13 DIAGNOSIS — R7301 Impaired fasting glucose: Secondary | ICD-10-CM | POA: Diagnosis not present

## 2015-02-13 LAB — COMPREHENSIVE METABOLIC PANEL
ALT: 24 U/L (ref 0–53)
AST: 16 U/L (ref 0–37)
Albumin: 4.2 g/dL (ref 3.5–5.2)
Alkaline Phosphatase: 45 U/L (ref 39–117)
BUN: 23 mg/dL (ref 6–23)
CALCIUM: 8.9 mg/dL (ref 8.4–10.5)
CHLORIDE: 106 meq/L (ref 96–112)
CO2: 26 meq/L (ref 19–32)
CREATININE: 0.72 mg/dL (ref 0.40–1.50)
GFR: 124.76 mL/min (ref >60.00)
GLUCOSE: 102 mg/dL — AB (ref 70–99)
Potassium: 4.7 mEq/L (ref 3.5–5.1)
Sodium: 139 mEq/L (ref 135–145)
Total Bilirubin: 0.4 mg/dL (ref 0.2–1.2)
Total Protein: 6.5 g/dL (ref 6.0–8.3)

## 2015-02-13 LAB — CBC WITH DIFFERENTIAL/PLATELET
BASOS ABS: 0.1 10*3/uL (ref 0.0–0.1)
BASOS PCT: 1 % (ref 0.0–3.0)
EOS ABS: 0.2 10*3/uL (ref 0.0–0.7)
Eosinophils Relative: 3.6 % (ref 0.0–5.0)
HEMATOCRIT: 43 % (ref 39.0–52.0)
Hemoglobin: 14.3 g/dL (ref 13.0–17.0)
LYMPHS ABS: 1.7 10*3/uL (ref 0.7–4.0)
LYMPHS PCT: 28.4 % (ref 12.0–46.0)
MCHC: 33.2 g/dL (ref 30.0–36.0)
MCV: 93.8 fl (ref 78.0–100.0)
MONO ABS: 0.4 10*3/uL (ref 0.1–1.0)
Monocytes Relative: 6.4 % (ref 3.0–12.0)
NEUTROS ABS: 3.7 10*3/uL (ref 1.4–7.7)
NEUTROS PCT: 60.6 % (ref 43.0–77.0)
PLATELETS: 259 10*3/uL (ref 150.0–400.0)
RBC: 4.58 Mil/uL (ref 4.22–5.81)
RDW: 14 % (ref 11.5–15.5)
WBC: 6.1 10*3/uL (ref 4.0–10.5)

## 2015-02-13 LAB — LIPID PANEL
CHOL/HDL RATIO: 3
Cholesterol: 159 mg/dL (ref 0–200)
HDL: 62 mg/dL (ref >39.00)
LDL CALC: 88 mg/dL (ref 0–99)
NONHDL: 97.18
TRIGLYCERIDES: 45 mg/dL (ref 0.0–149.0)
VLDL: 9 mg/dL (ref 0.0–40.0)

## 2015-02-13 LAB — TSH: TSH: 3.48 u[IU]/mL (ref 0.35–4.50)

## 2015-02-13 LAB — HEMOGLOBIN A1C: HEMOGLOBIN A1C: 5.7 % (ref 4.6–6.5)

## 2015-02-13 NOTE — Progress Notes (Signed)
Pre visit review using our clinic review tool, if applicable. No additional management support is needed unless otherwise documented below in the visit note. 

## 2015-02-13 NOTE — Progress Notes (Signed)
Office Note 02/13/2015  CC:  Chief Complaint  Patient presents with  . Annual Exam    Pt is fasting.     HPI:  Aaron Russell is a 44 y.o. White male who is here for annual health maintenance exam. Exercise: walking daily. Diet: trying to limit portion size/calories some.  No acute complaints.   Past Medical History  Diagnosis Date  . NEOPLASM, SKIN, UNCERTAIN BEHAVIOR 99991111    Hemangioma  . Obesity   . Impaired glucose tolerance   . Nephrolithiasis   . GERD (gastroesophageal reflux disease) 2013    Lanzoprazole helpful    Past Surgical History  Procedure Laterality Date  . Tonsillectomy      Family History  Problem Relation Age of Onset  . Diabetes Father     Social History   Social History  . Marital Status: Married    Spouse Name: N/A  . Number of Children: N/A  . Years of Education: N/A   Occupational History  . Not on file.   Social History Main Topics  . Smoking status: Former Smoker    Types: Cigarettes    Quit date: 02/16/1986  . Smokeless tobacco: Current User    Types: Snuff, Chew  . Alcohol Use: Yes  . Drug Use: No  . Sexual Activity: Not on file   Other Topics Concern  . Not on file   Social History Narrative   Married, one son and one daughter.   Orig from [place].   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   No exercise but not sedentary.   Quit smoking 25 yrs ago.   +chews tobacco.  Occ alcohol intake.  No hx of drugs or alcohol problems.          Outpatient Prescriptions Prior to Visit  Medication Sig Dispense Refill  . lansoprazole (PREVACID SOLUTAB) 15 MG disintegrating tablet Take 15 mg by mouth daily.     No facility-administered medications prior to visit.    No Known Allergies  ROS Review of Systems  Constitutional: Negative for fever, chills, appetite change and fatigue.  HENT: Negative for congestion, dental problem, ear pain and sore  throat.   Eyes: Negative for discharge, redness and visual disturbance.  Respiratory: Negative for cough, chest tightness, shortness of breath and wheezing.   Cardiovascular: Negative for chest pain, palpitations and leg swelling.  Gastrointestinal: Negative for nausea, vomiting, abdominal pain, diarrhea and blood in stool.  Genitourinary: Negative for dysuria, urgency, frequency, hematuria, flank pain and difficulty urinating.  Musculoskeletal: Negative for myalgias, back pain, joint swelling, arthralgias and neck stiffness.  Skin: Negative for pallor and rash.  Neurological: Negative for dizziness, speech difficulty, weakness and headaches.  Hematological: Negative for adenopathy. Does not bruise/bleed easily.  Psychiatric/Behavioral: Negative for confusion and sleep disturbance. The patient is not nervous/anxious.     PE; Blood pressure 119/78, pulse 63, temperature 98.1 F (36.7 C), temperature source Oral, resp. rate 16, height 5' 7.75" (1.721 m), weight 241 lb 4 oz (109.43 kg), SpO2 95 %. BMI 37 Gen: Alert, well appearing, obese-appearing WM in NAD.  Patient is oriented to person, place, time, and situation. AFFECT: pleasant, lucid thought and speech. ENT: Ears: EACs clear, normal epithelium.  TMs with good light reflex and landmarks bilaterally.  Eyes: no injection, icteris, swelling, or exudate.  EOMI, PERRLA. Nose: no drainage or turbinate edema/swelling.  No injection or focal lesion.  Mouth: lips without lesion/swelling.  Oral  mucosa pink and moist.  Dentition intact and without obvious caries or gingival swelling.  Oropharynx without erythema, exudate, or swelling.  Neck: supple/nontender.  No LAD, mass, or TM.  Carotid pulses 2+ bilaterally, without bruits. CV: RRR, no m/r/g.   LUNGS: CTA bilat, nonlabored resps, good aeration in all lung fields. ABD: soft, NT, rotund but ND, BS normal.  No hepatospenomegaly or mass.  No bruits. EXT: no clubbing, cyanosis, or edema.   Musculoskeletal: no joint swelling, erythema, warmth, or tenderness.  ROM of all joints intact. Skin - no sores or suspicious lesions or rashes or color changes   Pertinent labs:  Lab Results  Component Value Date   TSH 2.89 02/08/2014   Lab Results  Component Value Date   WBC 5.6 02/08/2014   HGB 14.7 02/08/2014   HCT 44.3 02/08/2014   MCV 93.3 02/08/2014   PLT 268.0 02/08/2014   Lab Results  Component Value Date   CREATININE 0.8 02/08/2014   BUN 14 02/08/2014   NA 139 02/08/2014   K 5.0 02/08/2014   CL 106 02/08/2014   CO2 25 02/08/2014   Lab Results  Component Value Date   ALT 23 02/08/2014   AST 17 02/08/2014   ALKPHOS 43 02/08/2014   BILITOT 0.8 02/08/2014   Lab Results  Component Value Date   CHOL 160 02/08/2014   Lab Results  Component Value Date   HDL 52.60 02/08/2014   Lab Results  Component Value Date   LDLCALC 93 02/08/2014   Lab Results  Component Value Date   TRIG 71.0 02/08/2014   Lab Results  Component Value Date   CHOLHDL 3 02/08/2014    ASSESSMENT AND PLAN:   Health maintenance exam: Reviewed age and gender appropriate health maintenance issues (prudent diet, regular exercise, health risks of tobacco and excessive alcohol, use of seatbelts, fire alarms in home, use of sunscreen).  Also reviewed age and gender appropriate health screening as well as vaccine recommendations. Vaccines UTD. HP labs drawn today, plus HbA1c due to pt's past hx of IFG. Encouraged him to increase exercise/diet/wt loss.  An After Visit Summary was printed and given to the patient.  FOLLOW UP:  Return in about 1 year (around 02/13/2016) for annual CPE (fasting).

## 2016-02-14 ENCOUNTER — Ambulatory Visit (INDEPENDENT_AMBULATORY_CARE_PROVIDER_SITE_OTHER): Payer: Managed Care, Other (non HMO) | Admitting: Family Medicine

## 2016-02-14 ENCOUNTER — Encounter: Payer: Self-pay | Admitting: Family Medicine

## 2016-02-14 VITALS — BP 111/76 | HR 66 | Temp 98.0°F | Resp 16 | Ht 68.0 in | Wt 238.8 lb

## 2016-02-14 DIAGNOSIS — Z0000 Encounter for general adult medical examination without abnormal findings: Secondary | ICD-10-CM

## 2016-02-14 DIAGNOSIS — R7301 Impaired fasting glucose: Secondary | ICD-10-CM

## 2016-02-14 LAB — CBC WITH DIFFERENTIAL/PLATELET
Basophils Absolute: 0 10*3/uL (ref 0.0–0.1)
Basophils Relative: 0.3 % (ref 0.0–3.0)
EOS PCT: 2.6 % (ref 0.0–5.0)
Eosinophils Absolute: 0.2 10*3/uL (ref 0.0–0.7)
HEMATOCRIT: 43.1 % (ref 39.0–52.0)
HEMOGLOBIN: 14.9 g/dL (ref 13.0–17.0)
Lymphocytes Relative: 23.6 % (ref 12.0–46.0)
Lymphs Abs: 1.7 10*3/uL (ref 0.7–4.0)
MCHC: 34.6 g/dL (ref 30.0–36.0)
MCV: 92.8 fl (ref 78.0–100.0)
MONOS PCT: 6.7 % (ref 3.0–12.0)
Monocytes Absolute: 0.5 10*3/uL (ref 0.1–1.0)
Neutro Abs: 4.7 10*3/uL (ref 1.4–7.7)
Neutrophils Relative %: 66.8 % (ref 43.0–77.0)
Platelets: 240 10*3/uL (ref 150.0–400.0)
RBC: 4.65 Mil/uL (ref 4.22–5.81)
RDW: 13.9 % (ref 11.5–15.5)
WBC: 7 10*3/uL (ref 4.0–10.5)

## 2016-02-14 LAB — COMPREHENSIVE METABOLIC PANEL
ALBUMIN: 4.5 g/dL (ref 3.5–5.2)
ALK PHOS: 48 U/L (ref 39–117)
ALT: 31 U/L (ref 0–53)
AST: 16 U/L (ref 0–37)
BUN: 25 mg/dL — AB (ref 6–23)
CO2: 29 mEq/L (ref 19–32)
Calcium: 9 mg/dL (ref 8.4–10.5)
Chloride: 104 mEq/L (ref 96–112)
Creatinine, Ser: 0.85 mg/dL (ref 0.40–1.50)
GFR: 102.56 mL/min (ref >60.00)
Glucose, Bld: 94 mg/dL (ref 70–99)
POTASSIUM: 5 meq/L (ref 3.5–5.1)
Sodium: 139 mEq/L (ref 135–145)
TOTAL PROTEIN: 6.7 g/dL (ref 6.0–8.3)
Total Bilirubin: 0.5 mg/dL (ref 0.2–1.2)

## 2016-02-14 LAB — LIPID PANEL
CHOLESTEROL: 162 mg/dL (ref 0–200)
HDL: 70.6 mg/dL (ref >39.00)
LDL Cholesterol: 81 mg/dL (ref 0–99)
NonHDL: 91.82
Total CHOL/HDL Ratio: 2
Triglycerides: 56 mg/dL (ref 0.0–149.0)
VLDL: 11.2 mg/dL (ref 0.0–40.0)

## 2016-02-14 LAB — TSH: TSH: 3.73 u[IU]/mL (ref 0.35–4.50)

## 2016-02-14 LAB — HEMOGLOBIN A1C: Hgb A1c MFr Bld: 5.7 % (ref 4.6–6.5)

## 2016-02-14 NOTE — Progress Notes (Signed)
Office Note 02/14/2016  CC:  Chief Complaint  Patient presents with  . Annual Exam    Pt is fasting.     HPI:  Aaron Russell is a 45 y.o. White male who is here for annual health maintenance exam. Exercise: walks daily. Diet: not focusing on anything in particular right now.  Eye exam: last one was 2 wks ago, all was fine. Dental: biannual preventative visits.    Past Medical History:  Diagnosis Date  . GERD (gastroesophageal reflux disease) 2013   Lanzoprazole helpful  . IFG (impaired fasting glucose)    A1c 5.9% 2015; 5.7% 2016.  Marland Kitchen NEOPLASM, SKIN, UNCERTAIN BEHAVIOR 99991111   Hemangioma  . Nephrolithiasis   . Obesity     Past Surgical History:  Procedure Laterality Date  . TONSILLECTOMY      Family History  Problem Relation Age of Onset  . Diabetes Father     Social History   Social History  . Marital status: Married    Spouse name: N/A  . Number of children: N/A  . Years of education: N/A   Occupational History  . Not on file.   Social History Main Topics  . Smoking status: Former Smoker    Types: Cigarettes    Quit date: 02/16/1986  . Smokeless tobacco: Current User    Types: Snuff, Chew  . Alcohol use Yes  . Drug use: No  . Sexual activity: Not on file   Other Topics Concern  . Not on file   Social History Narrative   Married, one son and one daughter.   Orig from [place].   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   No exercise but not sedentary.   Quit smoking 25 yrs ago.   +chews tobacco.  Occ alcohol intake.  No hx of drugs or alcohol problems.          Outpatient Medications Prior to Visit  Medication Sig Dispense Refill  . lansoprazole (PREVACID SOLUTAB) 15 MG disintegrating tablet Take 15 mg by mouth daily.     No facility-administered medications prior to visit.     No Known Allergies  ROS Review of Systems  Constitutional: Negative for appetite change,  chills, fatigue and fever.  HENT: Negative for congestion, dental problem, ear pain and sore throat.   Eyes: Negative for discharge, redness and visual disturbance.  Respiratory: Negative for cough, chest tightness, shortness of breath and wheezing.   Cardiovascular: Negative for chest pain, palpitations and leg swelling.  Gastrointestinal: Negative for abdominal pain, blood in stool, diarrhea, nausea and vomiting.  Genitourinary: Negative for difficulty urinating, dysuria, flank pain, frequency, hematuria and urgency.  Musculoskeletal: Negative for arthralgias, back pain, joint swelling, myalgias and neck stiffness.  Skin: Negative for pallor and rash.  Neurological: Negative for dizziness, speech difficulty, weakness and headaches.  Hematological: Negative for adenopathy. Does not bruise/bleed easily.  Psychiatric/Behavioral: Negative for confusion and sleep disturbance. The patient is not nervous/anxious.     PE; Blood pressure 111/76, pulse 66, temperature 98 F (36.7 C), temperature source Oral, resp. rate 16, height 5\' 8"  (1.727 m), weight 238 lb 12 oz (108.3 kg), SpO2 97 %. Body mass index is 36.3 kg/m.  Gen: Alert, well appearing.  Patient is oriented to person, place, time, and situation. AFFECT: pleasant, lucid thought and speech. ENT: Ears: EACs clear, normal epithelium.  TMs with good light reflex and landmarks bilaterally.  Eyes: no injection, icteris, swelling, or  exudate.  EOMI, PERRLA. Nose: no drainage or turbinate edema/swelling.  No injection or focal lesion.  Mouth: lips without lesion/swelling.  Oral mucosa pink and moist.  Dentition intact and without obvious caries or gingival swelling.  Oropharynx without erythema, exudate, or swelling.  Neck: supple/nontender.  No LAD, mass, or TM.  Carotid pulses 2+ bilaterally, without bruits. CV: RRR, no m/r/g.   LUNGS: CTA bilat, nonlabored resps, good aeration in all lung fields. ABD: soft, NT, ND, BS normal.  No  hepatospenomegaly or mass.  No bruits. EXT: no clubbing, cyanosis, or edema.  Musculoskeletal: no joint swelling, erythema, warmth, or tenderness.  ROM of all joints intact. Skin - no sores or suspicious lesions or rashes or color changes   Pertinent labs:  Lab Results  Component Value Date   TSH 3.48 02/13/2015   Lab Results  Component Value Date   WBC 6.1 02/13/2015   HGB 14.3 02/13/2015   HCT 43.0 02/13/2015   MCV 93.8 02/13/2015   PLT 259.0 02/13/2015   Lab Results  Component Value Date   CREATININE 0.72 02/13/2015   BUN 23 02/13/2015   NA 139 02/13/2015   K 4.7 02/13/2015   CL 106 02/13/2015   CO2 26 02/13/2015   Lab Results  Component Value Date   ALT 24 02/13/2015   AST 16 02/13/2015   ALKPHOS 45 02/13/2015   BILITOT 0.4 02/13/2015   Lab Results  Component Value Date   CHOL 159 02/13/2015   Lab Results  Component Value Date   HDL 62.00 02/13/2015   Lab Results  Component Value Date   LDLCALC 88 02/13/2015   Lab Results  Component Value Date   TRIG 45.0 02/13/2015   Lab Results  Component Value Date   CHOLHDL 3 02/13/2015   Lab Results  Component Value Date   HGBA1C 5.7 02/13/2015    ASSESSMENT AND PLAN:   Health maintenance exam: Reviewed age and gender appropriate health maintenance issues (prudent diet, regular exercise, health risks of tobacco and excessive alcohol, use of seatbelts, fire alarms in home, use of sunscreen).  Also reviewed age and gender appropriate health screening as well as vaccine recommendations. Fasting HP labs + Hba1c (hx of IFG) drawn today.  An After Visit Summary was printed and given to the patient.  FOLLOW UP:  Return in about 1 year (around 02/13/2017) for annual CPE (fasting).  Signed:  Crissie Sickles, MD           02/14/2016

## 2016-02-14 NOTE — Progress Notes (Signed)
Pre visit review using our clinic review tool, if applicable. No additional management support is needed unless otherwise documented below in the visit note. 

## 2016-02-17 ENCOUNTER — Encounter: Payer: Self-pay | Admitting: Family Medicine

## 2016-03-18 DIAGNOSIS — H40013 Open angle with borderline findings, low risk, bilateral: Secondary | ICD-10-CM | POA: Diagnosis not present

## 2016-04-16 DIAGNOSIS — D2272 Melanocytic nevi of left lower limb, including hip: Secondary | ICD-10-CM | POA: Diagnosis not present

## 2016-04-16 DIAGNOSIS — B353 Tinea pedis: Secondary | ICD-10-CM | POA: Diagnosis not present

## 2016-04-16 DIAGNOSIS — L570 Actinic keratosis: Secondary | ICD-10-CM | POA: Diagnosis not present

## 2016-04-16 DIAGNOSIS — D1801 Hemangioma of skin and subcutaneous tissue: Secondary | ICD-10-CM | POA: Diagnosis not present

## 2016-07-20 DIAGNOSIS — L570 Actinic keratosis: Secondary | ICD-10-CM | POA: Diagnosis not present

## 2016-10-27 DIAGNOSIS — L814 Other melanin hyperpigmentation: Secondary | ICD-10-CM | POA: Diagnosis not present

## 2016-10-27 DIAGNOSIS — D485 Neoplasm of uncertain behavior of skin: Secondary | ICD-10-CM | POA: Diagnosis not present

## 2017-02-14 NOTE — Progress Notes (Signed)
Office Note 02/15/2017  CC:  Chief Complaint  Patient presents with  . Annual Exam    Pt is fasting.    HPI:  Aaron Russell is a 46 y.o. male who is here for annual health maintenance exam.  Exercise: active but no formal exercise. Diet: not working on anything particular at this time---room for improvement here. Eyes: exam last year. Dental: preventatives UTD.  Past Medical History:  Diagnosis Date  . GERD (gastroesophageal reflux disease) 2013   Lanzoprazole helpful  . IFG (impaired fasting glucose)    A1c 5.9% 2015; 5.7% 2016.  5.7% 2017  . 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 Nephrolithiasis   . Obesity, Class II, BMI 35-39.9     Past Surgical History:  Procedure Laterality Date  . TONSILLECTOMY      Family History  Problem Relation Age of Onset  . Diabetes Father     Social History   Socioeconomic History  . Marital status: Married    Spouse name: Not on file  . Number of children: Not on file  . Years of education: Not on file  . Highest education level: Not on file  Social Needs  . Financial resource strain: Not on file  . Food insecurity - worry: Not on file  . Food insecurity - inability: Not on file  . Transportation needs - medical: Not on file  . Transportation needs - non-medical: Not on file  Occupational History  . Not on file  Tobacco Use  . Smoking status: Former Smoker    Types: Cigarettes    Last attempt to quit: 02/16/1986    Years since quitting: 31.0  . Smokeless tobacco: Current User    Types: Snuff, Chew  Substance and Sexual Activity  . Alcohol use: Yes  . Drug use: No  . Sexual activity: Not on file  Other Topics Concern  . Not on file  Social History Narrative   Married, one son and one daughter.   Orig from [place].   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   No exercise but not sedentary.   Quit smoking 25 yrs ago.   +chews  tobacco.  Occ alcohol intake.  No hx of drugs or alcohol problems.       Outpatient Medications Prior to Visit  Medication Sig Dispense Refill  . lansoprazole (PREVACID SOLUTAB) 15 MG disintegrating tablet Take 15 mg by mouth daily.     No facility-administered medications prior to visit.     No Known Allergies  ROS Review of Systems  Constitutional: Negative for appetite change, chills, fatigue and fever.  HENT: Negative for congestion, dental problem, ear pain and sore throat.   Eyes: Negative for discharge, redness and visual disturbance.  Respiratory: Negative for cough, chest tightness, shortness of breath and wheezing.   Cardiovascular: Negative for chest pain, palpitations and leg swelling.  Gastrointestinal: Negative for abdominal pain, blood in stool, diarrhea, nausea and vomiting.  Genitourinary: Negative for difficulty urinating, dysuria, flank pain, frequency, hematuria and urgency.  Musculoskeletal: Negative for arthralgias, back pain, joint swelling, myalgias and neck stiffness.  Skin: Negative for pallor and rash.  Neurological: Negative for dizziness, speech difficulty, weakness and headaches.  Hematological: Negative for adenopathy. Does not bruise/bleed easily.  Psychiatric/Behavioral: Negative for confusion and sleep disturbance. The patient is not nervous/anxious.     PE; Blood pressure 118/81, pulse 64, temperature (!) 97.5 F (36.4 C), temperature source  Oral, resp. rate 16, height 5\' 8"  (1.727 m), weight 243 lb 8 oz (110.5 kg), SpO2 93 %. Body mass index is 37.02 kg/m.  Gen: Alert, well appearing.  Patient is oriented to person, place, time, and situation. AFFECT: pleasant, lucid thought and speech. ENT: Ears: EACs clear, normal epithelium.  TMs with good light reflex and landmarks bilaterally.  Eyes: no injection, icteris, swelling, or exudate.  EOMI, PERRLA. Nose: no drainage or turbinate edema/swelling.  No injection or focal lesion.  Mouth: lips without  lesion/swelling.  Oral mucosa pink and moist.  Dentition intact and without obvious caries or gingival swelling.  Oropharynx without erythema, exudate, or swelling.  Neck: supple/nontender.  No LAD, mass, or TM.  Carotid pulses 2+ bilaterally, without bruits. CV: RRR, no m/r/g.   LUNGS: CTA bilat, nonlabored resps, good aeration in all lung fields. ABD: soft, NT, ND, BS normal.  No hepatospenomegaly or mass.  No bruits. EXT: no clubbing, cyanosis, or edema.  Musculoskeletal: no joint swelling, erythema, warmth, or tenderness.  ROM of all joints intact. Skin - no sores or suspicious lesions or rashes or color changes   Pertinent labs:  Lab Results  Component Value Date   TSH 3.73 02/14/2016   Lab Results  Component Value Date   WBC 7.0 02/14/2016   HGB 14.9 02/14/2016   HCT 43.1 02/14/2016   MCV 92.8 02/14/2016   PLT 240.0 02/14/2016   Lab Results  Component Value Date   CREATININE 0.85 02/14/2016   BUN 25 (H) 02/14/2016   NA 139 02/14/2016   K 5.0 02/14/2016   CL 104 02/14/2016   CO2 29 02/14/2016   Lab Results  Component Value Date   ALT 31 02/14/2016   AST 16 02/14/2016   ALKPHOS 48 02/14/2016   BILITOT 0.5 02/14/2016   Lab Results  Component Value Date   CHOL 162 02/14/2016   Lab Results  Component Value Date   HDL 70.60 02/14/2016   Lab Results  Component Value Date   LDLCALC 81 02/14/2016   Lab Results  Component Value Date   TRIG 56.0 02/14/2016   Lab Results  Component Value Date   CHOLHDL 2 02/14/2016   Lab Results  Component Value Date   HGBA1C 5.7 02/14/2016    ASSESSMENT AND PLAN:   Health maintenance exam: Reviewed age and gender appropriate health maintenance issues (prudent diet, regular exercise, health risks of tobacco and excessive alcohol, use of seatbelts, fire alarms in home, use of sunscreen).  Also reviewed age and gender appropriate health screening as well as vaccine recommendations. Vaccines: Flu vaccine-- UTD. Labs:  fasting HP and A1c (IFG). Prostate ca screening: avg risk= start at age 3 yrs. Colon ca screening: avg risk= start at age 15 yrs.  An After Visit Summary was printed and given to the patient.  FOLLOW UP:  Return in about 1 year (around 02/15/2018) for annual CPE (fasting).  Signed:  Crissie Sickles, MD           02/15/2017

## 2017-02-15 ENCOUNTER — Encounter: Payer: Self-pay | Admitting: Family Medicine

## 2017-02-15 ENCOUNTER — Ambulatory Visit (INDEPENDENT_AMBULATORY_CARE_PROVIDER_SITE_OTHER): Payer: BLUE CROSS/BLUE SHIELD | Admitting: Family Medicine

## 2017-02-15 VITALS — BP 118/81 | HR 64 | Temp 97.5°F | Resp 16 | Ht 68.0 in | Wt 243.5 lb

## 2017-02-15 DIAGNOSIS — R7301 Impaired fasting glucose: Secondary | ICD-10-CM

## 2017-02-15 DIAGNOSIS — Z0000 Encounter for general adult medical examination without abnormal findings: Secondary | ICD-10-CM | POA: Diagnosis not present

## 2017-02-15 LAB — CBC
HCT: 44.2 % (ref 39.0–52.0)
Hemoglobin: 14.9 g/dL (ref 13.0–17.0)
MCHC: 33.8 g/dL (ref 30.0–36.0)
MCV: 96.1 fl (ref 78.0–100.0)
Platelets: 259 10*3/uL (ref 150.0–400.0)
RBC: 4.6 Mil/uL (ref 4.22–5.81)
RDW: 13.4 % (ref 11.5–15.5)
WBC: 5.5 10*3/uL (ref 4.0–10.5)

## 2017-02-15 LAB — COMPREHENSIVE METABOLIC PANEL
ALBUMIN: 4.5 g/dL (ref 3.5–5.2)
ALK PHOS: 44 U/L (ref 39–117)
ALT: 20 U/L (ref 0–53)
AST: 14 U/L (ref 0–37)
BUN: 21 mg/dL (ref 6–23)
CALCIUM: 8.8 mg/dL (ref 8.4–10.5)
CO2: 28 mEq/L (ref 19–32)
CREATININE: 0.72 mg/dL (ref 0.40–1.50)
Chloride: 103 mEq/L (ref 96–112)
GFR: 123.69 mL/min (ref >60.00)
Glucose, Bld: 95 mg/dL (ref 70–99)
Potassium: 4.3 mEq/L (ref 3.5–5.1)
SODIUM: 140 meq/L (ref 135–145)
TOTAL PROTEIN: 6.8 g/dL (ref 6.0–8.3)
Total Bilirubin: 0.6 mg/dL (ref 0.2–1.2)

## 2017-02-15 LAB — LIPID PANEL
CHOLESTEROL: 160 mg/dL (ref 0–200)
HDL: 54.3 mg/dL (ref >39.00)
LDL Cholesterol: 87 mg/dL (ref 0–99)
NonHDL: 105.51
TRIGLYCERIDES: 92 mg/dL (ref 0.0–149.0)
Total CHOL/HDL Ratio: 3
VLDL: 18.4 mg/dL (ref 0.0–40.0)

## 2017-02-15 LAB — TSH: TSH: 5.24 u[IU]/mL — ABNORMAL HIGH (ref 0.35–4.50)

## 2017-02-15 LAB — HEMOGLOBIN A1C: Hgb A1c MFr Bld: 5.8 % (ref 4.6–6.5)

## 2017-02-15 NOTE — Patient Instructions (Signed)

## 2017-02-18 ENCOUNTER — Other Ambulatory Visit: Payer: Self-pay | Admitting: Family Medicine

## 2017-02-18 ENCOUNTER — Other Ambulatory Visit (INDEPENDENT_AMBULATORY_CARE_PROVIDER_SITE_OTHER): Payer: BLUE CROSS/BLUE SHIELD

## 2017-02-18 DIAGNOSIS — R7989 Other specified abnormal findings of blood chemistry: Secondary | ICD-10-CM

## 2017-02-18 LAB — T4, FREE: FREE T4: 0.8 ng/dL (ref 0.60–1.60)

## 2017-02-19 ENCOUNTER — Encounter: Payer: Self-pay | Admitting: *Deleted

## 2017-02-19 LAB — T3: T3 TOTAL: 136 ng/dL (ref 76–181)

## 2018-09-12 ENCOUNTER — Encounter: Payer: BLUE CROSS/BLUE SHIELD | Admitting: Family Medicine

## 2018-09-12 ENCOUNTER — Telehealth: Payer: Self-pay

## 2018-09-12 DIAGNOSIS — R7301 Impaired fasting glucose: Secondary | ICD-10-CM

## 2018-09-12 DIAGNOSIS — R7989 Other specified abnormal findings of blood chemistry: Secondary | ICD-10-CM

## 2018-09-12 DIAGNOSIS — Z125 Encounter for screening for malignant neoplasm of prostate: Secondary | ICD-10-CM

## 2018-09-12 DIAGNOSIS — Z0000 Encounter for general adult medical examination without abnormal findings: Secondary | ICD-10-CM

## 2018-09-12 NOTE — Telephone Encounter (Signed)
Contacted patient to reschedule for CPE. Pt wanted to make separate lab appt since CPE was rescheduled for afternoon appt.   Please advise if labs can be entered for lab appt Wednesday 7/29 or what labs need to be entered, thanks.

## 2018-09-12 NOTE — Telephone Encounter (Signed)
Ok. Orders are in. Lab visit is fine.

## 2018-09-14 ENCOUNTER — Other Ambulatory Visit: Payer: Self-pay

## 2018-09-14 ENCOUNTER — Ambulatory Visit (INDEPENDENT_AMBULATORY_CARE_PROVIDER_SITE_OTHER): Payer: BC Managed Care – PPO | Admitting: Family Medicine

## 2018-09-14 DIAGNOSIS — Z125 Encounter for screening for malignant neoplasm of prostate: Secondary | ICD-10-CM

## 2018-09-14 DIAGNOSIS — R7301 Impaired fasting glucose: Secondary | ICD-10-CM | POA: Diagnosis not present

## 2018-09-14 DIAGNOSIS — R7989 Other specified abnormal findings of blood chemistry: Secondary | ICD-10-CM

## 2018-09-14 LAB — CBC WITH DIFFERENTIAL/PLATELET
Basophils Absolute: 0.1 10*3/uL (ref 0.0–0.1)
Basophils Relative: 1.5 % (ref 0.0–3.0)
Eosinophils Absolute: 0.2 10*3/uL (ref 0.0–0.7)
Eosinophils Relative: 4.1 % (ref 0.0–5.0)
HCT: 43.6 % (ref 39.0–52.0)
Hemoglobin: 14.5 g/dL (ref 13.0–17.0)
Lymphocytes Relative: 31.7 % (ref 12.0–46.0)
Lymphs Abs: 1.7 10*3/uL (ref 0.7–4.0)
MCHC: 33.3 g/dL (ref 30.0–36.0)
MCV: 95.5 fl (ref 78.0–100.0)
Monocytes Absolute: 0.4 10*3/uL (ref 0.1–1.0)
Monocytes Relative: 8 % (ref 3.0–12.0)
Neutro Abs: 3 10*3/uL (ref 1.4–7.7)
Neutrophils Relative %: 54.7 % (ref 43.0–77.0)
Platelets: 245 10*3/uL (ref 150.0–400.0)
RBC: 4.57 Mil/uL (ref 4.22–5.81)
RDW: 13.8 % (ref 11.5–15.5)
WBC: 5.5 10*3/uL (ref 4.0–10.5)

## 2018-09-14 LAB — LIPID PANEL
Cholesterol: 150 mg/dL (ref 0–200)
HDL: 58.6 mg/dL (ref >39.00)
LDL Cholesterol: 79 mg/dL (ref 0–99)
NonHDL: 91.67
Total CHOL/HDL Ratio: 3
Triglycerides: 61 mg/dL (ref 0.0–149.0)
VLDL: 12.2 mg/dL (ref 0.0–40.0)

## 2018-09-14 LAB — PSA: PSA: 0.27 ng/mL (ref 0.10–4.00)

## 2018-09-14 LAB — COMPREHENSIVE METABOLIC PANEL
ALT: 16 U/L (ref 0–53)
AST: 13 U/L (ref 0–37)
Albumin: 4.6 g/dL (ref 3.5–5.2)
Alkaline Phosphatase: 41 U/L (ref 39–117)
BUN: 18 mg/dL (ref 6–23)
CO2: 29 mEq/L (ref 19–32)
Calcium: 9.1 mg/dL (ref 8.4–10.5)
Chloride: 104 mEq/L (ref 96–112)
Creatinine, Ser: 0.74 mg/dL (ref 0.40–1.50)
GFR: 112.02 mL/min (ref >60.00)
Glucose, Bld: 105 mg/dL — ABNORMAL HIGH (ref 70–99)
Potassium: 4.6 mEq/L (ref 3.5–5.1)
Sodium: 139 mEq/L (ref 135–145)
Total Bilirubin: 0.6 mg/dL (ref 0.2–1.2)
Total Protein: 6.7 g/dL (ref 6.0–8.3)

## 2018-09-14 LAB — T4, FREE: Free T4: 0.96 ng/dL (ref 0.60–1.60)

## 2018-09-14 LAB — TSH: TSH: 3.96 u[IU]/mL (ref 0.35–4.50)

## 2018-09-14 LAB — HEMOGLOBIN A1C: Hgb A1c MFr Bld: 5.6 % (ref 4.6–6.5)

## 2018-09-15 ENCOUNTER — Ambulatory Visit (INDEPENDENT_AMBULATORY_CARE_PROVIDER_SITE_OTHER): Payer: BC Managed Care – PPO | Admitting: Family Medicine

## 2018-09-15 ENCOUNTER — Encounter: Payer: Self-pay | Admitting: Family Medicine

## 2018-09-15 VITALS — BP 127/86 | HR 76 | Temp 99.5°F | Resp 16 | Ht 68.0 in | Wt 235.8 lb

## 2018-09-15 DIAGNOSIS — Z23 Encounter for immunization: Secondary | ICD-10-CM

## 2018-09-15 DIAGNOSIS — E669 Obesity, unspecified: Secondary | ICD-10-CM | POA: Insufficient documentation

## 2018-09-15 DIAGNOSIS — Z0000 Encounter for general adult medical examination without abnormal findings: Secondary | ICD-10-CM

## 2018-09-15 LAB — T3: T3, Total: 121 ng/dL (ref 76–181)

## 2018-09-15 NOTE — Addendum Note (Signed)
Addended by: Deveron Furlong D on: 09/15/2018 01:40 PM   Modules accepted: Orders

## 2018-09-15 NOTE — Patient Instructions (Signed)

## 2018-09-15 NOTE — Progress Notes (Signed)
Office Note 09/15/2018  CC:  Chief Complaint  Patient presents with  . Annual Exam    pt is not fasting    HPI:  Aaron Russell is a 48 y.o. male who is here for annual health maintenance exam. Reviewed all recent fasting labs: all great.  Exercise: walking 10K steps most days.  Mows yards, sometimes push mowing. Diet: "I eat what I want".     Past Medical History:  Diagnosis Date  . GERD (gastroesophageal reflux disease) 2013   Lanzoprazole helpful  . IFG (impaired fasting glucose)    A1c 5.9% 2015; 5.7% 2016.  5.7% 2017. 5.6% 2020.  Marland Kitchen NEOPLASM, SKIN, UNCERTAIN BEHAVIOR 2/35/3614   Hemangioma  . Nephrolithiasis   . Obesity, Class II, BMI 35-39.9     Past Surgical History:  Procedure Laterality Date  . TONSILLECTOMY      Family History  Problem Relation Age of Onset  . Diabetes Father     Social History   Socioeconomic History  . Marital status: Married    Spouse name: Not on file  . Number of children: Not on file  . Years of education: Not on file  . Highest education level: Not on file  Occupational History  . Not on file  Social Needs  . Financial resource strain: Not on file  . Food insecurity    Worry: Not on file    Inability: Not on file  . Transportation needs    Medical: Not on file    Non-medical: Not on file  Tobacco Use  . Smoking status: Former Smoker    Types: Cigarettes    Quit date: 02/16/1986    Years since quitting: 32.6  . Smokeless tobacco: Current User    Types: Snuff, Chew  Substance and Sexual Activity  . Alcohol use: Yes  . Drug use: No  . Sexual activity: Not on file  Lifestyle  . Physical activity    Days per week: Not on file    Minutes per session: Not on file  . Stress: Not on file  Relationships  . Social Herbalist on phone: Not on file    Gets together: Not on file    Attends religious service: Not on file    Active member of club or organization: Not on file    Attends meetings of clubs or  organizations: Not on file    Relationship status: Not on file  . Intimate partner violence    Fear of current or ex partner: Not on file    Emotionally abused: Not on file    Physically abused: Not on file    Forced sexual activity: Not on file  Other Topics Concern  . Not on file  Social History Narrative   Married, one son and one daughter.   Orig from [place].   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   No exercise but not sedentary.   Quit smoking 25 yrs ago.   +chews tobacco.  Occ alcohol intake.  No hx of drugs or alcohol problems.       Outpatient Medications Prior to Visit  Medication Sig Dispense Refill  . lansoprazole (PREVACID SOLUTAB) 15 MG disintegrating tablet Take 15 mg by mouth daily.     No facility-administered medications prior to visit.     No Known Allergies  ROS Review of Systems  Constitutional: Negative for appetite change, chills, fatigue and fever.  HENT: Negative for congestion, dental problem, ear pain and sore throat.   Eyes: Negative for discharge, redness and visual disturbance.  Respiratory: Negative for cough, chest tightness, shortness of breath and wheezing.   Cardiovascular: Negative for chest pain, palpitations and leg swelling.  Gastrointestinal: Negative for abdominal pain, blood in stool, diarrhea, nausea and vomiting.  Genitourinary: Negative for difficulty urinating, dysuria, flank pain, frequency, hematuria and urgency.  Musculoskeletal: Negative for arthralgias, back pain, joint swelling, myalgias and neck stiffness.  Skin: Negative for pallor and rash.  Neurological: Negative for dizziness, speech difficulty, weakness and headaches.  Hematological: Negative for adenopathy. Does not bruise/bleed easily.  Psychiatric/Behavioral: Negative for confusion and sleep disturbance. The patient is not nervous/anxious.     PE; Blood pressure 127/86, pulse 76, temperature 99.5 F  (37.5 C), temperature source Temporal, resp. rate 16, height 5\' 8"  (1.727 m), weight 235 lb 12.8 oz (107 kg), SpO2 97 %. Body mass index is 35.85 kg/m.  Gen: Alert, well appearing.  Patient is oriented to person, place, time, and situation. AFFECT: pleasant, lucid thought and speech. ENT: Ears: EACs clear, normal epithelium.  TMs with good light reflex and landmarks bilaterally.  Eyes: no injection, icteris, swelling, or exudate.  EOMI, PERRLA. Nose: no drainage or turbinate edema/swelling.  No injection or focal lesion.  Mouth: lips without lesion/swelling.  Oral mucosa pink and moist.  Dentition intact and without obvious caries or gingival swelling.  Oropharynx without erythema, exudate, or swelling.  Neck: supple/nontender.  No LAD, mass, or TM.  Carotid pulses 2+ bilaterally, without bruits. CV: RRR, no m/r/g.   LUNGS: CTA bilat, nonlabored resps, good aeration in all lung fields. ABD: soft, NT, ND, BS normal.  No hepatospenomegaly or mass.  No bruits. EXT: no clubbing, cyanosis, or edema.  Musculoskeletal: no joint swelling, erythema, warmth, or tenderness.  ROM of all joints intact. Skin - no sores or suspicious lesions or rashes or color changes Rectal exam: negative without mass, lesions or tenderness, PROSTATE EXAM: smooth and symmetric without nodules or tenderness.   Pertinent labs:  Lab Results  Component Value Date   TSH 3.96 09/14/2018   Lab Results  Component Value Date   WBC 5.5 09/14/2018   HGB 14.5 09/14/2018   HCT 43.6 09/14/2018   MCV 95.5 09/14/2018   PLT 245.0 09/14/2018   Lab Results  Component Value Date   CREATININE 0.74 09/14/2018   BUN 18 09/14/2018   NA 139 09/14/2018   K 4.6 09/14/2018   CL 104 09/14/2018   CO2 29 09/14/2018   Lab Results  Component Value Date   ALT 16 09/14/2018   AST 13 09/14/2018   ALKPHOS 41 09/14/2018   BILITOT 0.6 09/14/2018  fasting glucose was 105 on 09/14/18  Lab Results  Component Value Date   CHOL 150  09/14/2018   Lab Results  Component Value Date   HDL 58.60 09/14/2018   Lab Results  Component Value Date   LDLCALC 79 09/14/2018   Lab Results  Component Value Date   TRIG 61.0 09/14/2018   Lab Results  Component Value Date   CHOLHDL 3 09/14/2018   Lab Results  Component Value Date   PSA 0.27 09/14/2018   Lab Results  Component Value Date   HGBA1C 5.6 09/14/2018   ASSESSMENT AND PLAN:   Health maintenance exam: Reviewed age and gender appropriate health maintenance issues (prudent diet, regular exercise, health risks of tobacco and excessive alcohol, use of seatbelts, fire alarms in home, use of  sunscreen).  Also reviewed age and gender appropriate health screening as well as vaccine recommendations. Vaccines: Tdap is due-->Tdap. Labs: reviewed all recent labs-->nl except very mild fasting glucose, A1c very reassuring. Prostate ca screening: DRE today normal, PSA yesterday normal. Colon ca screening: pt defers this until next cpe 1 yr.  An After Visit Summary was printed and given to the patient.  FOLLOW UP:  Return in about 1 year (around 09/15/2019) for annual CPE (fasting).  Signed:  Crissie Sickles, MD           09/15/2018

## 2018-11-18 ENCOUNTER — Ambulatory Visit (INDEPENDENT_AMBULATORY_CARE_PROVIDER_SITE_OTHER): Payer: BC Managed Care – PPO

## 2018-11-18 ENCOUNTER — Other Ambulatory Visit: Payer: Self-pay

## 2018-11-18 DIAGNOSIS — Z23 Encounter for immunization: Secondary | ICD-10-CM | POA: Diagnosis not present

## 2019-01-30 DIAGNOSIS — H40023 Open angle with borderline findings, high risk, bilateral: Secondary | ICD-10-CM | POA: Diagnosis not present

## 2019-04-24 DIAGNOSIS — Z23 Encounter for immunization: Secondary | ICD-10-CM | POA: Diagnosis not present

## 2019-05-23 DIAGNOSIS — Z23 Encounter for immunization: Secondary | ICD-10-CM | POA: Diagnosis not present

## 2019-09-20 ENCOUNTER — Encounter: Payer: BC Managed Care – PPO | Admitting: Family Medicine

## 2019-10-30 ENCOUNTER — Ambulatory Visit (INDEPENDENT_AMBULATORY_CARE_PROVIDER_SITE_OTHER): Payer: BC Managed Care – PPO | Admitting: Family Medicine

## 2019-10-30 ENCOUNTER — Encounter: Payer: Self-pay | Admitting: Family Medicine

## 2019-10-30 ENCOUNTER — Other Ambulatory Visit: Payer: Self-pay

## 2019-10-30 VITALS — BP 120/77 | HR 59 | Temp 97.7°F | Resp 16 | Ht 67.5 in | Wt 241.0 lb

## 2019-10-30 DIAGNOSIS — E669 Obesity, unspecified: Secondary | ICD-10-CM | POA: Diagnosis not present

## 2019-10-30 DIAGNOSIS — Z0000 Encounter for general adult medical examination without abnormal findings: Secondary | ICD-10-CM | POA: Diagnosis not present

## 2019-10-30 DIAGNOSIS — Z1211 Encounter for screening for malignant neoplasm of colon: Secondary | ICD-10-CM | POA: Diagnosis not present

## 2019-10-30 DIAGNOSIS — Z125 Encounter for screening for malignant neoplasm of prostate: Secondary | ICD-10-CM

## 2019-10-30 DIAGNOSIS — R7301 Impaired fasting glucose: Secondary | ICD-10-CM

## 2019-10-30 DIAGNOSIS — K219 Gastro-esophageal reflux disease without esophagitis: Secondary | ICD-10-CM

## 2019-10-30 LAB — CBC WITH DIFFERENTIAL/PLATELET
Basophils Absolute: 0.3 10*3/uL — ABNORMAL HIGH (ref 0.0–0.1)
Basophils Relative: 6.4 % — ABNORMAL HIGH (ref 0.0–3.0)
Eosinophils Absolute: 0.2 10*3/uL (ref 0.0–0.7)
Eosinophils Relative: 3.5 % (ref 0.0–5.0)
HCT: 43.8 % (ref 39.0–52.0)
Hemoglobin: 14.5 g/dL (ref 13.0–17.0)
Lymphocytes Relative: 29.7 % (ref 12.0–46.0)
Lymphs Abs: 1.5 10*3/uL (ref 0.7–4.0)
MCHC: 33.1 g/dL (ref 30.0–36.0)
MCV: 95.9 fl (ref 78.0–100.0)
Monocytes Absolute: 0.4 10*3/uL (ref 0.1–1.0)
Monocytes Relative: 7.4 % (ref 3.0–12.0)
Neutro Abs: 2.7 10*3/uL (ref 1.4–7.7)
Neutrophils Relative %: 53 % (ref 43.0–77.0)
Platelets: 250 10*3/uL (ref 150.0–400.0)
RBC: 4.56 Mil/uL (ref 4.22–5.81)
RDW: 13.6 % (ref 11.5–15.5)
WBC: 5 10*3/uL (ref 4.0–10.5)

## 2019-10-30 LAB — LIPID PANEL
Cholesterol: 157 mg/dL (ref 0–200)
HDL: 56.5 mg/dL (ref >39.00)
LDL Cholesterol: 81 mg/dL (ref 0–99)
NonHDL: 100.34
Total CHOL/HDL Ratio: 3
Triglycerides: 98 mg/dL (ref 0.0–149.0)
VLDL: 19.6 mg/dL (ref 0.0–40.0)

## 2019-10-30 LAB — COMPREHENSIVE METABOLIC PANEL
ALT: 17 U/L (ref 0–53)
AST: 14 U/L (ref 0–37)
Albumin: 4.4 g/dL (ref 3.5–5.2)
Alkaline Phosphatase: 47 U/L (ref 39–117)
BUN: 16 mg/dL (ref 6–23)
CO2: 29 mEq/L (ref 19–32)
Calcium: 9 mg/dL (ref 8.4–10.5)
Chloride: 105 mEq/L (ref 96–112)
Creatinine, Ser: 0.81 mg/dL (ref 0.40–1.50)
GFR: 100.47 mL/min (ref >60.00)
Glucose, Bld: 96 mg/dL (ref 70–99)
Potassium: 4.7 mEq/L (ref 3.5–5.1)
Sodium: 142 mEq/L (ref 135–145)
Total Bilirubin: 0.6 mg/dL (ref 0.2–1.2)
Total Protein: 6.8 g/dL (ref 6.0–8.3)

## 2019-10-30 LAB — TSH: TSH: 4.09 u[IU]/mL (ref 0.35–4.50)

## 2019-10-30 LAB — PSA: PSA: 0.27 ng/mL (ref 0.10–4.00)

## 2019-10-30 LAB — HEMOGLOBIN A1C: Hgb A1c MFr Bld: 5.8 % (ref 4.6–6.5)

## 2019-10-30 NOTE — Addendum Note (Signed)
Addended by: Tammi Sou on: 10/30/2019 09:37 AM   Modules accepted: Level of Service

## 2019-10-30 NOTE — Progress Notes (Addendum)
Office Note 10/30/2019  CC:  Chief Complaint  Patient presents with  . Annual Exam    pt is fasting    HPI:  Aaron Russell is a 49 y.o.  male who is here for annual health maintenance exam, IFG, GERD, and obesity class II.  Not working on diet and exercise at all, no plans to at this time. Dips chewing tobacco.  Gets dental visits twice a year.  He is aware of the diet recommended to avoid progression to diabetes. Eats out some, "not a whole lot of fast food".  Snacking at home w/out being hungry is a problem.  GERD: takes prn when eats poor food choice, avg 2-3 times a week.   Past Medical History:  Diagnosis Date  . GERD (gastroesophageal reflux disease) 2013   Lanzoprazole helpful  . IFG (impaired fasting glucose)    A1c 5.9% 2015; 5.7% 2016.  5.7% 2017. 5.6% 2020.  Marland Kitchen NEOPLASM, SKIN, UNCERTAIN BEHAVIOR 4/33/2951   Hemangioma  . Nephrolithiasis   . Obesity, Class II, BMI 35-39.9     Past Surgical History:  Procedure Laterality Date  . TONSILLECTOMY      Family History  Problem Relation Age of Onset  . Diabetes Father     Social History   Socioeconomic History  . Marital status: Married    Spouse name: Not on file  . Number of children: Not on file  . Years of education: Not on file  . Highest education level: Not on file  Occupational History  . Not on file  Tobacco Use  . Smoking status: Former Smoker    Types: Cigarettes    Quit date: 02/16/1986    Years since quitting: 33.7  . Smokeless tobacco: Current User    Types: Snuff, Chew  Substance and Sexual Activity  . Alcohol use: Yes  . Drug use: No  . Sexual activity: Not on file  Other Topics Concern  . Not on file  Social History Narrative   Married, one son and one daughter.   Orig from [place].   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   No exercise but not sedentary.   Quit smoking 25 yrs ago.   +chews tobacco.  Occ alcohol  intake.  No hx of drugs or alcohol problems.      Social Determinants of Health   Financial Resource Strain:   . Difficulty of Paying Living Expenses: Not on file  Food Insecurity:   . Worried About Charity fundraiser in the Last Year: Not on file  . Ran Out of Food in the Last Year: Not on file  Transportation Needs:   . Lack of Transportation (Medical): Not on file  . Lack of Transportation (Non-Medical): Not on file  Physical Activity:   . Days of Exercise per Week: Not on file  . Minutes of Exercise per Session: Not on file  Stress:   . Feeling of Stress : Not on file  Social Connections:   . Frequency of Communication with Friends and Family: Not on file  . Frequency of Social Gatherings with Friends and Family: Not on file  . Attends Religious Services: Not on file  . Active Member of Clubs or Organizations: Not on file  . Attends Archivist Meetings: Not on file  . Marital Status: Not on file  Intimate Partner Violence:   . Fear of Current or Ex-Partner: Not on file  .  Emotionally Abused: Not on file  . Physically Abused: Not on file  . Sexually Abused: Not on file    Outpatient Medications Prior to Visit  Medication Sig Dispense Refill  . lansoprazole (PREVACID SOLUTAB) 15 MG disintegrating tablet Take 15 mg by mouth daily.     No facility-administered medications prior to visit.    No Known Allergies  ROS Review of Systems  Constitutional: Negative for appetite change, chills, fatigue and fever.  HENT: Negative for congestion, dental problem, ear pain and sore throat.   Eyes: Negative for discharge, redness and visual disturbance.  Respiratory: Negative for cough, chest tightness, shortness of breath and wheezing.   Cardiovascular: Negative for chest pain, palpitations and leg swelling.  Gastrointestinal: Negative for abdominal pain, blood in stool, diarrhea, nausea and vomiting.  Genitourinary: Negative for difficulty urinating, dysuria, flank  pain, frequency, hematuria and urgency.  Musculoskeletal: Negative for arthralgias, back pain, joint swelling, myalgias and neck stiffness.  Skin: Negative for pallor and rash.  Neurological: Negative for dizziness, speech difficulty, weakness and headaches.  Hematological: Negative for adenopathy. Does not bruise/bleed easily.  Psychiatric/Behavioral: Negative for confusion and sleep disturbance. The patient is not nervous/anxious.     PE; Vitals with BMI 10/30/2019 09/15/2018 02/15/2017  Height 5' 7.5" 5\' 8"  5\' 8"   Weight 241 lbs 235 lbs 13 oz 243 lbs 8 oz  BMI 37.17 92.33 00.76  Systolic 226 333 545  Diastolic 77 86 81  Pulse 59 76 64     Gen: Alert, well appearing.  Patient is oriented to person, place, time, and situation. AFFECT: pleasant, lucid thought and speech. ENT: Ears: EACs clear, normal epithelium.  TMs with good light reflex and landmarks bilaterally.  Eyes: no injection, icteris, swelling, or exudate.  EOMI, PERRLA. Nose: no drainage or turbinate edema/swelling.  No injection or focal lesion.  Mouth: lips without lesion/swelling.  Oral mucosa pink and moist.  Dentition intact and without obvious caries or gingival swelling.  Oropharynx without erythema, exudate, or swelling.  Neck: supple/nontender.  No LAD, mass, or TM.  Carotid pulses 2+ bilaterally, without bruits. CV: RRR, no m/r/g.   LUNGS: CTA bilat, nonlabored resps, good aeration in all lung fields. ABD: soft, NT, ND, BS normal.  No hepatospenomegaly or mass.  No bruits. EXT: no clubbing, cyanosis, or edema.  Musculoskeletal: no joint swelling, erythema, warmth, or tenderness.  ROM of all joints intact. Skin - no sores or suspicious lesions or rashes or color changes Rectal exam: negative without mass, lesions or tenderness, PROSTATE EXAM: smooth and symmetric without nodules or tenderness.   Pertinent labs:  Lab Results  Component Value Date   TSH 3.96 09/14/2018   Lab Results  Component Value Date   WBC  5.5 09/14/2018   HGB 14.5 09/14/2018   HCT 43.6 09/14/2018   MCV 95.5 09/14/2018   PLT 245.0 09/14/2018   Lab Results  Component Value Date   CREATININE 0.74 09/14/2018   BUN 18 09/14/2018   NA 139 09/14/2018   K 4.6 09/14/2018   CL 104 09/14/2018   CO2 29 09/14/2018   Lab Results  Component Value Date   ALT 16 09/14/2018   AST 13 09/14/2018   ALKPHOS 41 09/14/2018   BILITOT 0.6 09/14/2018   Lab Results  Component Value Date   CHOL 150 09/14/2018   Lab Results  Component Value Date   HDL 58.60 09/14/2018   Lab Results  Component Value Date   LDLCALC 79 09/14/2018   Lab Results  Component Value Date   TRIG 61.0 09/14/2018   Lab Results  Component Value Date   CHOLHDL 3 09/14/2018   Lab Results  Component Value Date   PSA 0.27 09/14/2018   Lab Results  Component Value Date   HGBA1C 5.6 09/14/2018    ASSESSMENT AND PLAN:   1) IFG/Obesity class II: reviewed again in detail the preventative/TLC measures that he needs to focus on.  Hba1c, fasting gluc, and lipid panel today. I spent an additional 10 minutes today in detailed discussion of necessary diet and exercise changes regarding his risk of progression of his IFG/prediabetes and his obesity. I answered questions and he expressed understanding of the TLC plans.  2) GERD: stable, minimal requirement for PPI now that he has learned to avoid some dietary instigators.  3) Health maintenance exam: Reviewed age and gender appropriate health maintenance issues (prudent diet, regular exercise, health risks of tobacco and excessive alcohol, use of seatbelts, fire alarms in home, use of sunscreen).  Also reviewed age and gender appropriate health screening as well as vaccine recommendations. Vaccines: Tdap UTD.  Flu-->he'll get via employer.  Covid 19-->UTD.  Shingrix--he'll check with insurer about coverage. Labs: fasting HP, HbA1c (IFG, obesity), PSA. Prostate ca screening:  DRE , PSA. Colon ca screening: due for  initial colon ca screening->refer to GI today.  An After Visit Summary was printed and given to the patient.  FOLLOW UP:  No follow-ups on file.  Signed:  Crissie Sickles, MD           10/30/2019

## 2019-10-30 NOTE — Patient Instructions (Signed)
Call your insurer and ask if they cover the shingles vaccine ("Shingrix").   Health Maintenance, Male Adopting a healthy lifestyle and getting preventive care are important in promoting health and wellness. Ask your health care provider about:  The right schedule for you to have regular tests and exams.  Things you can do on your own to prevent diseases and keep yourself healthy. What should I know about diet, weight, and exercise? Eat a healthy diet   Eat a diet that includes plenty of vegetables, fruits, low-fat dairy products, and lean protein.  Do not eat a lot of foods that are high in solid fats, added sugars, or sodium. Maintain a healthy weight Body mass index (BMI) is a measurement that can be used to identify possible weight problems. It estimates body fat based on height and weight. Your health care provider can help determine your BMI and help you achieve or maintain a healthy weight. Get regular exercise Get regular exercise. This is one of the most important things you can do for your health. Most adults should:  Exercise for at least 150 minutes each week. The exercise should increase your heart rate and make you sweat (moderate-intensity exercise).  Do strengthening exercises at least twice a week. This is in addition to the moderate-intensity exercise.  Spend less time sitting. Even light physical activity can be beneficial. Watch cholesterol and blood lipids Have your blood tested for lipids and cholesterol at 51 years of age, then have this test every 5 years. You may need to have your cholesterol levels checked more often if:  Your lipid or cholesterol levels are high.  You are older than 51 years of age.  You are at high risk for heart disease. What should I know about cancer screening? Many types of cancers can be detected early and may often be prevented. Depending on your health history and family history, you may need to have cancer screening at various  ages. This may include screening for:  Colorectal cancer.  Prostate cancer.  Skin cancer.  Lung cancer. What should I know about heart disease, diabetes, and high blood pressure? Blood pressure and heart disease  High blood pressure causes heart disease and increases the risk of stroke. This is more likely to develop in people who have high blood pressure readings, are of African descent, or are overweight.  Talk with your health care provider about your target blood pressure readings.  Have your blood pressure checked: ? Every 3-5 years if you are 63-66 years of age. ? Every year if you are 23 years old or older.  If you are between the ages of 18 and 53 and are a current or former smoker, ask your health care provider if you should have a one-time screening for abdominal aortic aneurysm (AAA). Diabetes Have regular diabetes screenings. This checks your fasting blood sugar level. Have the screening done:  Once every three years after age 14 if you are at a normal weight and have a low risk for diabetes.  More often and at a younger age if you are overweight or have a high risk for diabetes. What should I know about preventing infection? Hepatitis B If you have a higher risk for hepatitis B, you should be screened for this virus. Talk with your health care provider to find out if you are at risk for hepatitis B infection. Hepatitis C Blood testing is recommended for:  Everyone born from [birth year] through 1967.  Anyone with known risk factors  for hepatitis C. Sexually transmitted infections (STIs)  You should be screened each year for STIs, including gonorrhea and chlamydia, if: ? You are sexually active and are younger than 51 years of age. ? You are older than 51 years of age and your health care provider tells you that you are at risk for this type of infection. ? Your sexual activity has changed since you were last screened, and you are at increased risk for chlamydia or  gonorrhea. Ask your health care provider if you are at risk.  Ask your health care provider about whether you are at high risk for HIV. Your health care provider may recommend a prescription medicine to help prevent HIV infection. If you choose to take medicine to prevent HIV, you should first get tested for HIV. You should then be tested every 3 months for as long as you are taking the medicine. Follow these instructions at home: Lifestyle  Do not use any products that contain nicotine or tobacco, such as cigarettes, e-cigarettes, and chewing tobacco. If you need help quitting, ask your health care provider.  Do not use street drugs.  Do not share needles.  Ask your health care provider for help if you need support or information about quitting drugs. Alcohol use  Do not drink alcohol if your health care provider tells you not to drink.  If you drink alcohol: ? Limit how much you have to 0-2 drinks a day. ? Be aware of how much alcohol is in your drink. In the [place] one drink equals one 12 oz bottle of beer (355 mL), one 5 oz glass of wine (148 mL), or one 1 oz glass of hard liquor (44 mL). General instructions  Schedule regular health, dental, and eye exams.  Stay current with your vaccines.  Tell your health care provider if: ? You often feel depressed. ? You have ever been abused or do not feel safe at home. Summary  Adopting a healthy lifestyle and getting preventive care are important in promoting health and wellness.  Follow your health care provider's instructions about healthy diet, exercising, and getting tested or screened for diseases.  Follow your health care provider's instructions on monitoring your cholesterol and blood pressure. This information is not intended to replace advice given to you by your health care provider. Make sure you discuss any questions you have with your health care provider. Document Revised: 01/26/2018 Document Reviewed:  01/26/2018 Elsevier Patient Education  2020 [employer].

## 2019-11-21 ENCOUNTER — Encounter: Payer: Self-pay | Admitting: Internal Medicine

## 2020-01-19 ENCOUNTER — Other Ambulatory Visit: Payer: Self-pay

## 2020-01-19 ENCOUNTER — Ambulatory Visit (AMBULATORY_SURGERY_CENTER): Payer: Self-pay | Admitting: *Deleted

## 2020-01-19 VITALS — Ht 67.5 in | Wt 237.0 lb

## 2020-01-19 DIAGNOSIS — Z1211 Encounter for screening for malignant neoplasm of colon: Secondary | ICD-10-CM

## 2020-01-19 MED ORDER — SUTAB 1479-225-188 MG PO TABS: 24.0000 | ORAL_TABLET | ORAL | 0 refills | Status: DC

## 2020-01-19 NOTE — Progress Notes (Addendum)
fully vax'd   No egg or soy allergy known to patient  No issues with past sedation with any surgeries or procedures no intubation problems in the past  No FH of Malignant Hyperthermia No diet pills per patient No home 02 use per patient  No blood thinners per patient  Pt denies issues with constipation  No A fib or A flutter  EMMI video to pt or via [place] 19 guidelines implemented in PV today with Pt and RN   Pt instructed NO CHEWING TOBACCO on Friday 12-17 at [facility] given to pt in PV today , Code to Pharmacy   Due to the COVID-19 pandemic we are asking patients to follow these guidelines. Please only bring one care partner. Please be aware that your care partner may wait in the car in the parking lot or if they feel like they will be too hot to wait in the car, they may wait in the lobby on the 4th floor. All care partners are required to wear a mask the entire time (we do not have any that we can provide them), they need to practice social distancing, and we will do a Covid check for all patient's and care partners when you arrive. Also we will check their temperature and your temperature. If the care partner waits in their car they need to stay in the parking lot the entire time and we will call them on their cell phone when the patient is ready for discharge so they can bring the car to the front of the building. Also all patient's will need to wear a mask into building.

## 2020-01-22 ENCOUNTER — Encounter: Payer: Self-pay | Admitting: Internal Medicine

## 2020-02-02 ENCOUNTER — Ambulatory Visit (AMBULATORY_SURGERY_CENTER): Payer: BC Managed Care – PPO | Admitting: Internal Medicine

## 2020-02-02 ENCOUNTER — Other Ambulatory Visit: Payer: Self-pay

## 2020-02-02 ENCOUNTER — Encounter: Payer: Self-pay | Admitting: Internal Medicine

## 2020-02-02 VITALS — BP 120/82 | HR 51 | Temp 97.3°F | Resp 17 | Ht 67.5 in | Wt 237.0 lb

## 2020-02-02 DIAGNOSIS — Z1211 Encounter for screening for malignant neoplasm of colon: Secondary | ICD-10-CM

## 2020-02-02 DIAGNOSIS — K635 Polyp of colon: Secondary | ICD-10-CM

## 2020-02-02 DIAGNOSIS — D124 Benign neoplasm of descending colon: Secondary | ICD-10-CM

## 2020-02-02 HISTORY — PX: COLONOSCOPY: SHX174

## 2020-02-02 MED ORDER — SODIUM CHLORIDE 0.9 % IV SOLN: 500.0000 mL | Freq: Once | INTRAVENOUS | Status: DC

## 2020-02-02 NOTE — Op Note (Signed)
[facility] Patient Name: Aaron Russell Procedure Date: 02/02/2020 10:57 AM MRN: [id number] Endoscopist: Jerene Bears , MD Age: 49 Referring MD:  Date of Birth: 12-28-1970 Gender: Male Account #: [id number] Procedure:                Colonoscopy Indications:              Screening for colorectal malignant neoplasm, This                            is the patient's first colonoscopy Medicines:                Monitored Anesthesia Care Procedure:                Pre-Anesthesia Assessment:                           - Prior to the procedure, a History and Physical                            was performed, and patient medications and                            allergies were reviewed. The patient's tolerance of                            previous anesthesia was also reviewed. The risks                            and benefits of the procedure and the sedation                            options and risks were discussed with the patient.                            All questions were answered, and informed consent                            was obtained. Prior Anticoagulants: The patient has                            taken no previous anticoagulant or antiplatelet                            agents. ASA Grade Assessment: II - A patient with                            mild systemic disease. After reviewing the risks                            and benefits, the patient was deemed in                            satisfactory condition to undergo the procedure.  After obtaining informed consent, the colonoscope                            was passed under direct vision. Throughout the                            procedure, the patient's blood pressure, pulse, and                            oxygen saturations were monitored continuously. The                            Olympus CF-HQ190L [id number]) Colonoscope was                            introduced through the anus and  advanced to the                            cecum, identified by appendiceal orifice and                            ileocecal valve. The colonoscopy was performed                            without difficulty. The patient tolerated the                            procedure well. The quality of the bowel                            preparation was good. The ileocecal valve,                            appendiceal orifice, and rectum were photographed. Scope In: 11:09:43 AM Scope Out: 11:26:36 AM Scope Withdrawal Time: 0 hours 13 minutes 54 seconds  Total Procedure Duration: 0 hours 16 minutes 53 seconds  Findings:                 The digital rectal exam was normal.                           A 5 mm polyp was found in the proximal descending                            colon. The polyp was sessile. The polyp was removed                            with a cold snare. Resection and retrieval were                            complete.                           Internal hemorrhoids and hypertrophied anal  papillae were found during retroflexion. The                            hemorrhoids were small.                           The exam was otherwise without abnormality. Complications:            No immediate complications. Estimated Blood Loss:     Estimated blood loss was minimal. Impression:               - One 5 mm polyp in the proximal descending colon,                            removed with a cold snare. Resected and retrieved.                           - Small internal hemorrhoids.                           - The examination was otherwise normal. Recommendation:           - Patient has a contact number available for                            emergencies. The signs and symptoms of potential                            delayed complications were discussed with the                            patient. Return to normal activities tomorrow.                            Written  discharge instructions were provided to the                            patient.                           - Resume previous diet.                           - Continue present medications.                           - Await pathology results.                           - Repeat colonoscopy is recommended. The                            colonoscopy date will be determined after pathology                            results from today's exam become available for  review. Jerene Bears, MD 02/02/2020 11:29:00 AM This report has been signed electronically.

## 2020-02-02 NOTE — Progress Notes (Signed)
PT taken to PACU. Monitors in place. VSS. Report given to RN. 

## 2020-02-02 NOTE — Progress Notes (Signed)
Called to room to assist during endoscopic procedure.  Patient ID and intended procedure confirmed with present staff. Received instructions for my participation in the procedure from the performing physician.  

## 2020-02-02 NOTE — Patient Instructions (Signed)
Information on polyps and hemorrhoids given to you today.  Await pathology results.  Resume previous diet and medications.  YOU HAD AN ENDOSCOPIC PROCEDURE TODAY AT [facility]:   Refer to the procedure report that was given to you for any specific questions about what was found during the examination.  If the procedure report does not answer your questions, please call your gastroenterologist to clarify.  If you requested that your care partner not be given the details of your procedure findings, then the procedure report has been included in a sealed envelope for you to review at your convenience later.  YOU SHOULD EXPECT: Some feelings of bloating in the abdomen. Passage of more gas than usual.  Walking can help get rid of the air that was put into your GI tract during the procedure and reduce the bloating. If you had a lower endoscopy (such as a colonoscopy or flexible sigmoidoscopy) you may notice spotting of blood in your stool or on the toilet paper. If you underwent a bowel prep for your procedure, you may not have a normal bowel movement for a few days.  Please Note:  You might notice some irritation and congestion in your nose or some drainage.  This is from the oxygen used during your procedure.  There is no need for concern and it should clear up in a day or so.  SYMPTOMS TO REPORT IMMEDIATELY:   Following lower endoscopy (colonoscopy or flexible sigmoidoscopy):  Excessive amounts of blood in the stool  Significant tenderness or worsening of abdominal pains  Swelling of the abdomen that is new, acute  Fever of 100F or higher  For urgent or emergent issues, a gastroenterologist can be reached at any hour by calling [phone]. Do not use MyChart messaging for urgent concerns.    DIET:  We do recommend a small meal at first, but then you may proceed to your regular diet.  Drink plenty of fluids but you should avoid alcoholic beverages for 24  hours.  ACTIVITY:  You should plan to take it easy for the rest of today and you should NOT DRIVE or use heavy machinery until tomorrow (because of the sedation medicines used during the test).    FOLLOW UP: Our staff will call the number listed on your records 48-72 hours following your procedure to check on you and address any questions or concerns that you may have regarding the information given to you following your procedure. If we do not reach you, we will leave a message.  We will attempt to reach you two times.  During this call, we will ask if you have developed any symptoms of COVID 19. If you develop any symptoms (ie: fever, flu-like symptoms, shortness of breath, cough etc.) before then, please call [phone].  If you test positive for Covid 19 in the 2 weeks post procedure, please call and report this information to [place].    If any biopsies were taken you will be contacted by phone or by letter within the next 1-3 weeks.  Please call us at [phone] if you have not heard about the biopsies in 3 weeks.    SIGNATURES/CONFIDENTIALITY: You and/or your care partner have signed paperwork which will be entered into your electronic medical record.  These signatures attest to the fact that that the information above on your After Visit Summary has been reviewed and is understood.  Full responsibility of the confidentiality of this discharge information lies with you and/or your  care-partner.

## 2020-02-02 NOTE — Progress Notes (Signed)
Medical history reviewed with no changes noted. VS assessed by C.W 

## 2020-02-06 ENCOUNTER — Telehealth: Payer: Self-pay | Admitting: *Deleted

## 2020-02-06 NOTE — Telephone Encounter (Signed)
  Follow up Call-  Call back number 02/02/2020  Post procedure Call Back phone  # [phone]  Permission to leave phone message Yes  Some recent data might be hidden     Patient questions:  Do you have a fever, pain , or abdominal swelling? No. Pain Score  0 *  Have you tolerated food without any problems? Yes.    Have you been able to return to your normal activities? Yes.    Do you have any questions about your discharge instructions: Diet   No. Medications  No. Follow up visit  No.  Do you have questions or concerns about your Care? No.  Actions: * If pain score is 4 or above: 1. No action needed, pain <4.Have you developed a fever since your procedure? no  2.   Have you had an respiratory symptoms (SOB or cough) since your procedure? no  3.   Have you tested positive for COVID 19 since your procedure no  4.   Have you had any family members/close contacts diagnosed with the COVID 19 since your procedure?  no   If yes to any of these questions please route to Joylene John, RN and Joella Prince, RN

## 2020-02-12 ENCOUNTER — Encounter: Payer: Self-pay | Admitting: Internal Medicine

## 2021-01-28 ENCOUNTER — Other Ambulatory Visit: Payer: Self-pay

## 2021-01-29 ENCOUNTER — Encounter: Payer: Self-pay | Admitting: Family Medicine

## 2021-01-29 ENCOUNTER — Ambulatory Visit (INDEPENDENT_AMBULATORY_CARE_PROVIDER_SITE_OTHER): Payer: BC Managed Care – PPO | Admitting: Family Medicine

## 2021-01-29 VITALS — BP 110/74 | HR 64 | Temp 97.6°F | Ht 68.5 in | Wt 228.6 lb

## 2021-01-29 DIAGNOSIS — Z0000 Encounter for general adult medical examination without abnormal findings: Secondary | ICD-10-CM | POA: Diagnosis not present

## 2021-01-29 DIAGNOSIS — R7301 Impaired fasting glucose: Secondary | ICD-10-CM

## 2021-01-29 DIAGNOSIS — Z125 Encounter for screening for malignant neoplasm of prostate: Secondary | ICD-10-CM | POA: Diagnosis not present

## 2021-01-29 LAB — COMPREHENSIVE METABOLIC PANEL
ALT: 17 U/L (ref 0–53)
AST: 15 U/L (ref 0–37)
Albumin: 4.4 g/dL (ref 3.5–5.2)
Alkaline Phosphatase: 44 U/L (ref 39–117)
BUN: 19 mg/dL (ref 6–23)
CO2: 27 mEq/L (ref 19–32)
Calcium: 9.1 mg/dL (ref 8.4–10.5)
Chloride: 105 mEq/L (ref 96–112)
Creatinine, Ser: 0.71 mg/dL (ref 0.40–1.50)
GFR: 105.68 mL/min (ref >60.00)
Glucose, Bld: 93 mg/dL (ref 70–99)
Potassium: 4.5 mEq/L (ref 3.5–5.1)
Sodium: 139 mEq/L (ref 135–145)
Total Bilirubin: 0.7 mg/dL (ref 0.2–1.2)
Total Protein: 6.7 g/dL (ref 6.0–8.3)

## 2021-01-29 LAB — CBC WITH DIFFERENTIAL/PLATELET
Basophils Absolute: 0 10*3/uL (ref 0.0–0.1)
Basophils Relative: 0.8 % (ref 0.0–3.0)
Eosinophils Absolute: 0.2 10*3/uL (ref 0.0–0.7)
Eosinophils Relative: 3.4 % (ref 0.0–5.0)
HCT: 43.6 % (ref 39.0–52.0)
Hemoglobin: 14.5 g/dL (ref 13.0–17.0)
Lymphocytes Relative: 28.4 % (ref 12.0–46.0)
Lymphs Abs: 1.4 10*3/uL (ref 0.7–4.0)
MCHC: 33.2 g/dL (ref 30.0–36.0)
MCV: 95.6 fl (ref 78.0–100.0)
Monocytes Absolute: 0.4 10*3/uL (ref 0.1–1.0)
Monocytes Relative: 8.2 % (ref 3.0–12.0)
Neutro Abs: 2.9 10*3/uL (ref 1.4–7.7)
Neutrophils Relative %: 59.2 % (ref 43.0–77.0)
Platelets: 247 10*3/uL (ref 150.0–400.0)
RBC: 4.56 Mil/uL (ref 4.22–5.81)
RDW: 13.5 % (ref 11.5–15.5)
WBC: 4.9 10*3/uL (ref 4.0–10.5)

## 2021-01-29 LAB — TSH: TSH: 3.75 u[IU]/mL (ref 0.35–5.50)

## 2021-01-29 LAB — LIPID PANEL
Cholesterol: 153 mg/dL (ref 0–200)
HDL: 65.8 mg/dL (ref >39.00)
LDL Cholesterol: 77 mg/dL (ref 0–99)
NonHDL: 87.33
Total CHOL/HDL Ratio: 2
Triglycerides: 54 mg/dL (ref 0.0–149.0)
VLDL: 10.8 mg/dL (ref 0.0–40.0)

## 2021-01-29 LAB — PSA: PSA: 0.28 ng/mL (ref 0.10–4.00)

## 2021-01-29 LAB — HEMOGLOBIN A1C: Hgb A1c MFr Bld: 5.7 % (ref 4.6–6.5)

## 2021-01-29 NOTE — Progress Notes (Signed)
Office Note 01/29/2021  CC:  Chief Complaint  Patient presents with   Annual Exam    Pt is fasting    HPI:  Patient is a 50 y.o. male who is here for annual health maintenance exam. He feels good.  He is trying to walk more and have better eating habits.  He has lost 12 pounds over the last 15 months.  Daughter at [facility] state and gets to see her soon for the holidays.  Past Medical History:  Diagnosis Date   GERD (gastroesophageal reflux disease) 2013   Lanzoprazole helpful   IFG (impaired fasting glucose)    A1c 5.9% 2015; 5.7% 2016.  5.7% 2017. 5.6% 2020.   NEOPLASM, SKIN, UNCERTAIN BEHAVIOR 8/92/1194   Hemangioma   Nephrolithiasis    Obesity, Class II, BMI 35-39.9     Past Surgical History:  Procedure Laterality Date   COLONOSCOPY  02/02/2020   2021 hyperplastic polyp->recall 2031   TONSILLECTOMY      Family History  Problem Relation Age of Onset   Diabetes Father    Colon cancer Neg Hx    Colon polyps Neg Hx    Esophageal cancer Neg Hx    Rectal cancer Neg Hx    Stomach cancer Neg Hx     Social History   Socioeconomic History   Marital status: Married    Spouse name: Not on file   Number of children: Not on file   Years of education: Not on file   Highest education level: Not on file  Occupational History   Not on file  Tobacco Use   Smoking status: Former    Types: Cigarettes    Quit date: 02/16/1986    Years since quitting: 34.9   Smokeless tobacco: Current    Types: Snuff, Chew  Substance and Sexual Activity   Alcohol use: Yes    Comment: social    Drug use: No   Sexual activity: Not on file  Other Topics Concern   Not on file  Social History Narrative   Married, one son and one daughter.   Orig from [place].   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   No exercise but not sedentary.   Quit smoking 25 yrs ago.   +chews tobacco.  Occ alcohol intake.  No hx of drugs or alcohol  problems.      Social Determinants of Health   Financial Resource Strain: Not on file  Food Insecurity: Not on file  Transportation Needs: Not on file  Physical Activity: Not on file  Stress: Not on file  Social Connections: Not on file  Intimate Partner Violence: Not on file    Outpatient Medications Prior to Visit  Medication Sig Dispense Refill   lansoprazole (PREVACID SOLUTAB) 15 MG disintegrating tablet Take 15 mg by mouth as needed.      Facility-Administered Medications Prior to Visit  Medication Dose Route Frequency Provider Last Rate Last Admin   0.9 %  sodium chloride infusion  500 mL Intravenous Once Pyrtle, Lajuan Lines, MD        No Known Allergies  ROS Review of Systems  Constitutional:  Negative for appetite change, chills, fatigue and fever.  HENT:  Negative for congestion, dental problem, ear pain and sore throat.   Eyes:  Negative for discharge, redness and visual disturbance.  Respiratory:  Negative for cough, chest tightness, shortness of breath and wheezing.   Cardiovascular:  Negative for chest  pain, palpitations and leg swelling.  Gastrointestinal:  Negative for abdominal pain, blood in stool, diarrhea, nausea and vomiting.  Genitourinary:  Negative for difficulty urinating, dysuria, flank pain, frequency, hematuria and urgency.  Musculoskeletal:  Negative for arthralgias, back pain, joint swelling, myalgias and neck stiffness.  Skin:  Negative for pallor and rash.  Neurological:  Negative for dizziness, speech difficulty, weakness and headaches.  Hematological:  Negative for adenopathy. Does not bruise/bleed easily.  Psychiatric/Behavioral:  Negative for confusion and sleep disturbance. The patient is not nervous/anxious.    PE; Vitals with BMI 01/29/2021 02/02/2020 02/02/2020  Height 5' 8.5" - -  Weight 228 lbs 10 oz - -  BMI 34.74 - -  Systolic 259 563 875  Diastolic 74 82 78  Pulse 64 51 65   Gen: Alert, well appearing.  Patient is oriented to  person, place, time, and situation. AFFECT: pleasant, lucid thought and speech. ENT: Ears: EACs clear, normal epithelium.  TMs with good light reflex and landmarks bilaterally.  Eyes: no injection, icteris, swelling, or exudate.  EOMI, PERRLA. Nose: no drainage or turbinate edema/swelling.  No injection or focal lesion.  Mouth: lips without lesion/swelling.  Oral mucosa pink and moist.  Dentition intact and without obvious caries or gingival swelling.  Oropharynx without erythema, exudate, or swelling.  Neck: supple/nontender.  No LAD, mass, or TM.  Carotid pulses 2+ bilaterally, without bruits. CV: RRR, no m/r/g.   LUNGS: CTA bilat, nonlabored resps, good aeration in all lung fields. ABD: soft, NT, ND, BS normal.  No hepatospenomegaly or mass.  No bruits. EXT: no clubbing, cyanosis, or edema.  Musculoskeletal: no joint swelling, erythema, warmth, or tenderness.  ROM of all joints intact. Skin - no sores or suspicious lesions or rashes or color changes  Pertinent labs:  Lab Results  Component Value Date   TSH 4.09 10/30/2019   Lab Results  Component Value Date   WBC 5.0 10/30/2019   HGB 14.5 10/30/2019   HCT 43.8 10/30/2019   MCV 95.9 10/30/2019   PLT 250.0 10/30/2019   Lab Results  Component Value Date   CREATININE 0.81 10/30/2019   BUN 16 10/30/2019   NA 142 10/30/2019   K 4.7 10/30/2019   CL 105 10/30/2019   CO2 29 10/30/2019   Lab Results  Component Value Date   ALT 17 10/30/2019   AST 14 10/30/2019   ALKPHOS 47 10/30/2019   BILITOT 0.6 10/30/2019   Lab Results  Component Value Date   CHOL 157 10/30/2019   Lab Results  Component Value Date   HDL 56.50 10/30/2019   Lab Results  Component Value Date   LDLCALC 81 10/30/2019   Lab Results  Component Value Date   TRIG 98.0 10/30/2019   Lab Results  Component Value Date   CHOLHDL 3 10/30/2019   Lab Results  Component Value Date   PSA 0.27 10/30/2019   PSA 0.27 09/14/2018   Lab Results  Component Value  Date   HGBA1C 5.8 10/30/2019   ASSESSMENT AND PLAN:   Health maintenance exam: Reviewed age and gender appropriate health maintenance issues (prudent diet, regular exercise, health risks of tobacco and excessive alcohol, use of seatbelts, fire alarms in home, use of sunscreen).  Also reviewed age and gender appropriate health screening as well as vaccine recommendations. Vaccines: shingrix->he'll consider.  Flu->UTD. Labs: fasting HP + PSA + Hba1c (prediabetes). Prostate ca screening: PSA today Colon ca screening: recall 2031  An After Visit Summary was printed and given to  the patient.  FOLLOW UP:  Return in about 1 year (around 01/29/2022).  Signed:  Crissie Sickles, MD           01/29/2021

## 2021-01-29 NOTE — Patient Instructions (Signed)

## 2022-02-04 ENCOUNTER — Ambulatory Visit (INDEPENDENT_AMBULATORY_CARE_PROVIDER_SITE_OTHER): Payer: BC Managed Care – PPO | Admitting: Family Medicine

## 2022-02-04 ENCOUNTER — Encounter: Payer: Self-pay | Admitting: Family Medicine

## 2022-02-04 VITALS — BP 114/76 | HR 70 | Temp 97.9°F | Ht 69.5 in | Wt 238.6 lb

## 2022-02-04 DIAGNOSIS — Z0000 Encounter for general adult medical examination without abnormal findings: Secondary | ICD-10-CM

## 2022-02-04 DIAGNOSIS — Z125 Encounter for screening for malignant neoplasm of prostate: Secondary | ICD-10-CM | POA: Diagnosis not present

## 2022-02-04 DIAGNOSIS — R7301 Impaired fasting glucose: Secondary | ICD-10-CM

## 2022-02-04 LAB — COMPREHENSIVE METABOLIC PANEL
ALT: 22 U/L (ref 0–53)
AST: 18 U/L (ref 0–37)
Albumin: 4.8 g/dL (ref 3.5–5.2)
Alkaline Phosphatase: 50 U/L (ref 39–117)
BUN: 20 mg/dL (ref 6–23)
CO2: 28 mEq/L (ref 19–32)
Calcium: 9.5 mg/dL (ref 8.4–10.5)
Chloride: 102 mEq/L (ref 96–112)
Creatinine, Ser: 0.78 mg/dL (ref 0.40–1.50)
GFR: 101.99 mL/min (ref >60.00)
Glucose, Bld: 98 mg/dL (ref 70–99)
Potassium: 4.8 mEq/L (ref 3.5–5.1)
Sodium: 139 mEq/L (ref 135–145)
Total Bilirubin: 0.6 mg/dL (ref 0.2–1.2)
Total Protein: 7.2 g/dL (ref 6.0–8.3)

## 2022-02-04 LAB — CBC
HCT: 44.3 % (ref 39.0–52.0)
Hemoglobin: 15.1 g/dL (ref 13.0–17.0)
MCHC: 34.1 g/dL (ref 30.0–36.0)
MCV: 95.1 fl (ref 78.0–100.0)
Platelets: 282 10*3/uL (ref 150.0–400.0)
RBC: 4.66 Mil/uL (ref 4.22–5.81)
RDW: 13.3 % (ref 11.5–15.5)
WBC: 5 10*3/uL (ref 4.0–10.5)

## 2022-02-04 LAB — LIPID PANEL
Cholesterol: 167 mg/dL (ref 0–200)
HDL: 63.7 mg/dL (ref >39.00)
LDL Cholesterol: 90 mg/dL (ref 0–99)
NonHDL: 103.28
Total CHOL/HDL Ratio: 3
Triglycerides: 65 mg/dL (ref 0.0–149.0)
VLDL: 13 mg/dL (ref 0.0–40.0)

## 2022-02-04 LAB — TSH: TSH: 3.36 u[IU]/mL (ref 0.35–5.50)

## 2022-02-04 LAB — PSA: PSA: 0.34 ng/mL (ref 0.10–4.00)

## 2022-02-04 LAB — HEMOGLOBIN A1C: Hgb A1c MFr Bld: 5.8 % (ref 4.6–6.5)

## 2022-02-04 NOTE — Patient Instructions (Signed)
Health Maintenance, Male Adopting a healthy lifestyle and getting preventive care are important in promoting health and wellness. Ask your health care provider about: The right schedule for you to have regular tests and exams. Things you can do on your own to prevent diseases and keep yourself healthy. What should I know about diet, weight, and exercise? Eat a healthy diet  Eat a diet that includes plenty of vegetables, fruits, low-fat dairy products, and lean protein. Do not eat a lot of foods that are high in solid fats, added sugars, or sodium. Maintain a healthy weight Body mass index (BMI) is a measurement that can be used to identify possible weight problems. It estimates body fat based on height and weight. Your health care provider can help determine your BMI and help you achieve or maintain a healthy weight. Get regular exercise Get regular exercise. This is one of the most important things you can do for your health. Most adults should: Exercise for at least 150 minutes each week. The exercise should increase your heart rate and make you sweat (moderate-intensity exercise). Do strengthening exercises at least twice a week. This is in addition to the moderate-intensity exercise. Spend less time sitting. Even light physical activity can be beneficial. Watch cholesterol and blood lipids Have your blood tested for lipids and cholesterol at 53 years of age, then have this test every 5 years. You may need to have your cholesterol levels checked more often if: Your lipid or cholesterol levels are high. You are older than 53 years of age. You are at high risk for heart disease. What should I know about cancer screening? Many types of cancers can be detected early and may often be prevented. Depending on your health history and family history, you may need to have cancer screening at various ages. This may include screening for: Colorectal cancer. Prostate cancer. Skin cancer. Lung  cancer. What should I know about heart disease, diabetes, and high blood pressure? Blood pressure and heart disease High blood pressure causes heart disease and increases the risk of stroke. This is more likely to develop in people who have high blood pressure readings or are overweight. Talk with your health care provider about your target blood pressure readings. Have your blood pressure checked: Every 3-5 years if you are 18-39 years of age. Every year if you are 40 years old or older. If you are between the ages of 65 and 75 and are a current or former smoker, ask your health care provider if you should have a one-time screening for abdominal aortic aneurysm (AAA). Diabetes Have regular diabetes screenings. This checks your fasting blood sugar level. Have the screening done: Once every three years after age 45 if you are at a normal weight and have a low risk for diabetes. More often and at a younger age if you are overweight or have a high risk for diabetes. What should I know about preventing infection? Hepatitis B If you have a higher risk for hepatitis B, you should be screened for this virus. Talk with your health care provider to find out if you are at risk for hepatitis B infection. Hepatitis C Blood testing is recommended for: Everyone born from 1945 through 1965. Anyone with known risk factors for hepatitis C. Sexually transmitted infections (STIs) You should be screened each year for STIs, including gonorrhea and chlamydia, if: You are sexually active and are younger than 53 years of age. You are older than 53 years of age and your   health care provider tells you that you are at risk for this type of infection. Your sexual activity has changed since you were last screened, and you are at increased risk for chlamydia or gonorrhea. Ask your health care provider if you are at risk. Ask your health care provider about whether you are at high risk for HIV. Your health care provider  may recommend a prescription medicine to help prevent HIV infection. If you choose to take medicine to prevent HIV, you should first get tested for HIV. You should then be tested every 3 months for as long as you are taking the medicine. Follow these instructions at home: Alcohol use Do not drink alcohol if your health care provider tells you not to drink. If you drink alcohol: Limit how much you have to 0-2 drinks a day. Know how much alcohol is in your drink. In the [place] one drink equals one 12 oz bottle of beer (355 mL), one 5 oz glass of wine (148 mL), or one 1 oz glass of hard liquor (44 mL). Lifestyle Do not use any products that contain nicotine or tobacco. These products include cigarettes, chewing tobacco, and vaping devices, such as e-cigarettes. If you need help quitting, ask your health care provider. Do not use street drugs. Do not share needles. Ask your health care provider for help if you need support or information about quitting drugs. General instructions Schedule regular health, dental, and eye exams. Stay current with your vaccines. Tell your health care provider if: You often feel depressed. You have ever been abused or do not feel safe at home. Summary Adopting a healthy lifestyle and getting preventive care are important in promoting health and wellness. Follow your health care provider's instructions about healthy diet, exercising, and getting tested or screened for diseases. Follow your health care provider's instructions on monitoring your cholesterol and blood pressure. This information is not intended to replace advice given to you by your health care provider. Make sure you discuss any questions you have with your health care provider. Document Revised: 06/24/2020 Document Reviewed: 06/24/2020 Elsevier Patient Education  2023 Elsevier Inc.  

## 2022-02-04 NOTE — Progress Notes (Signed)
Office Note 02/04/2022  CC:  Chief Complaint  Patient presents with   Annual Exam    Pt is fasting    HPI:  Patient is a 51 y.o. male who is here for annual health maintenance exam. Feeling well but he does comment that he has some numbness on the ulnar aspect of the left arm that extends down into the fourth and fifth digits.  Exclusively occurs at night when he wakes up in the morning, says he often positioned his arm so that he has extra pressure on that area while sleeping.  He shakes it off in the morning and it does not come back during the day.  No pain in the elbow forearm or wrist.  No arm weakness. No history of elbow injury.  Past Medical History:  Diagnosis Date   GERD (gastroesophageal reflux disease) 2013   Lanzoprazole helpful   IFG (impaired fasting glucose)    A1c 5.9% 2015; 5.7% 2016.  5.7% 2017. 5.6% 2020.   NEOPLASM, SKIN, UNCERTAIN BEHAVIOR 7/51/7001   Hemangioma   Nephrolithiasis    Obesity, Class II, BMI 35-39.9     Past Surgical History:  Procedure Laterality Date   COLONOSCOPY  02/02/2020   2021 hyperplastic polyp->recall 2031   TONSILLECTOMY      Family History  Problem Relation Age of Onset   Diabetes Father    Colon cancer Neg Hx    Colon polyps Neg Hx    Esophageal cancer Neg Hx    Rectal cancer Neg Hx    Stomach cancer Neg Hx     Social History   Socioeconomic History   Marital status: Married    Spouse name: Not on file   Number of children: Not on file   Years of education: Not on file   Highest education level: Not on file  Occupational History   Not on file  Tobacco Use   Smoking status: Former    Types: Cigarettes    Quit date: 02/16/1986    Years since quitting: 35.9   Smokeless tobacco: Current    Types: Snuff, Chew  Substance and Sexual Activity   Alcohol use: Yes    Comment: social    Drug use: No   Sexual activity: Not on file  Other Topics Concern   Not on file  Social History Narrative   Married, one son  and one daughter.   Orig from [place].   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   No exercise but not sedentary.   Quit smoking 25 yrs ago.   +chews tobacco.  Occ alcohol intake.  No hx of drugs or alcohol problems.      Social Determinants of Health   Financial Resource Strain: Not on file  Food Insecurity: Not on file  Transportation Needs: Not on file  Physical Activity: Not on file  Stress: Not on file  Social Connections: Not on file  Intimate Partner Violence: Not on file    Outpatient Medications Prior to Visit  Medication Sig Dispense Refill   lansoprazole (PREVACID SOLUTAB) 15 MG disintegrating tablet Take 15 mg by mouth as needed.      0.9 %  sodium chloride infusion      No facility-administered medications prior to visit.    No Known Allergies  ROS Review of Systems  Constitutional:  Negative for appetite change, chills, fatigue and fever.  HENT:  Negative for congestion, dental problem, ear pain and  sore throat.   Eyes:  Negative for discharge, redness and visual disturbance.  Respiratory:  Negative for cough, chest tightness, shortness of breath and wheezing.   Cardiovascular:  Negative for chest pain, palpitations and leg swelling.  Gastrointestinal:  Negative for abdominal pain, blood in stool, diarrhea, nausea and vomiting.  Genitourinary:  Negative for difficulty urinating, dysuria, flank pain, frequency, hematuria and urgency.  Musculoskeletal:  Negative for arthralgias, back pain, joint swelling, myalgias and neck stiffness.  Skin:  Negative for pallor and rash.  Neurological:  Positive for numbness (left arm). Negative for dizziness, speech difficulty, weakness and headaches.  Hematological:  Negative for adenopathy. Does not bruise/bleed easily.  Psychiatric/Behavioral:  Negative for confusion and sleep disturbance. The patient is not nervous/anxious.     PE;    02/04/2022    8:13 AM  01/29/2021    8:06 AM 02/02/2020   11:50 AM  Vitals with BMI  Height 5' 9.5" 5' 8.5"   Weight 238 lbs 10 oz 228 lbs 10 oz   BMI 79.48 01.65   Systolic 537 482 707  Diastolic 76 74 82  Pulse 70 64 51   Gen: Alert, well appearing.  Patient is oriented to person, place, time, and situation. AFFECT: pleasant, lucid thought and speech. ENT: Ears: EACs clear, normal epithelium.  TMs with good light reflex and landmarks bilaterally.  Eyes: no injection, icteris, swelling, or exudate.  EOMI, PERRLA. Nose: no drainage or turbinate edema/swelling.  No injection or focal lesion.  Mouth: lips without lesion/swelling.  Oral mucosa pink and moist.  Dentition intact and without obvious caries or gingival swelling.  Oropharynx without erythema, exudate, or swelling.  Neck: supple/nontender.  No LAD, mass, or TM.  Carotid pulses 2+ bilaterally, without bruits. CV: RRR, no m/r/g.   LUNGS: CTA bilat, nonlabored resps, good aeration in all lung fields. ABD: soft, NT, ND, BS normal.  No hepatospenomegaly or mass.  No bruits. EXT: no clubbing, cyanosis, or edema.  Musculoskeletal: no joint swelling, erythema, warmth, or tenderness.  ROM of all joints intact.  No elbow tenderness to palpation in the cubital tunnel.  No ulnar nerve subluxation. Skin - no sores or suspicious lesions or rashes or color changes  Pertinent labs:  Lab Results  Component Value Date   TSH 3.75 01/29/2021   Lab Results  Component Value Date   WBC 4.9 01/29/2021   HGB 14.5 01/29/2021   HCT 43.6 01/29/2021   MCV 95.6 01/29/2021   PLT 247.0 01/29/2021   Lab Results  Component Value Date   CREATININE 0.71 01/29/2021   BUN 19 01/29/2021   NA 139 01/29/2021   K 4.5 01/29/2021   CL 105 01/29/2021   CO2 27 01/29/2021   Lab Results  Component Value Date   ALT 17 01/29/2021   AST 15 01/29/2021   ALKPHOS 44 01/29/2021   BILITOT 0.7 01/29/2021   Lab Results  Component Value Date   CHOL 153 01/29/2021   Lab Results   Component Value Date   HDL 65.80 01/29/2021   Lab Results  Component Value Date   LDLCALC 77 01/29/2021   Lab Results  Component Value Date   TRIG 54.0 01/29/2021   Lab Results  Component Value Date   CHOLHDL 2 01/29/2021   Lab Results  Component Value Date   PSA 0.28 01/29/2021   PSA 0.27 10/30/2019   PSA 0.27 09/14/2018   Lab Results  Component Value Date   HGBA1C 5.7 01/29/2021   ASSESSMENT AND PLAN:   #  1 health maintenance exam: Reviewed age and gender appropriate health maintenance issues (prudent diet, regular exercise, health risks of tobacco and excessive alcohol, use of seatbelts, fire alarms in home, use of sunscreen).  Also reviewed age and gender appropriate health screening as well as vaccine recommendations. Vaccines: Shingirx->declined.  Flu->UTD. otherwise up-to-date. 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 Reassured.  Try to keep pressure off this area when sleeping.  An After Visit Summary was printed and given to the patient.  FOLLOW UP:  Return in about 1 year (around 02/05/2023) for annual CPE (fasting).  Signed:  Crissie Sickles, MD           02/04/2022

## 2022-02-09 ENCOUNTER — Encounter: Payer: Self-pay | Admitting: Family Medicine

## 2022-11-17 DIAGNOSIS — L821 Other seborrheic keratosis: Secondary | ICD-10-CM | POA: Diagnosis not present

## 2022-11-17 DIAGNOSIS — D485 Neoplasm of uncertain behavior of skin: Secondary | ICD-10-CM | POA: Diagnosis not present

## 2022-11-17 DIAGNOSIS — L570 Actinic keratosis: Secondary | ICD-10-CM | POA: Diagnosis not present

## 2022-11-17 DIAGNOSIS — L814 Other melanin hyperpigmentation: Secondary | ICD-10-CM | POA: Diagnosis not present

## 2023-04-02 ENCOUNTER — Encounter: Payer: BC Managed Care – PPO | Admitting: Family Medicine

## 2023-04-26 NOTE — Patient Instructions (Incomplete)

## 2023-04-27 ENCOUNTER — Ambulatory Visit (INDEPENDENT_AMBULATORY_CARE_PROVIDER_SITE_OTHER): Payer: BC Managed Care – PPO | Admitting: Family Medicine

## 2023-04-27 VITALS — BP 120/80 | HR 73 | Temp 97.7°F | Ht 68.75 in | Wt 235.8 lb

## 2023-04-27 DIAGNOSIS — R7301 Impaired fasting glucose: Secondary | ICD-10-CM | POA: Diagnosis not present

## 2023-04-27 DIAGNOSIS — Z125 Encounter for screening for malignant neoplasm of prostate: Secondary | ICD-10-CM | POA: Diagnosis not present

## 2023-04-27 DIAGNOSIS — Z0000 Encounter for general adult medical examination without abnormal findings: Secondary | ICD-10-CM | POA: Diagnosis not present

## 2023-04-27 LAB — COMPREHENSIVE METABOLIC PANEL
ALT: 15 U/L (ref 0–53)
AST: 15 U/L (ref 0–37)
Albumin: 4.6 g/dL (ref 3.5–5.2)
Alkaline Phosphatase: 48 U/L (ref 39–117)
BUN: 18 mg/dL (ref 6–23)
CO2: 27 meq/L (ref 19–32)
Calcium: 9.3 mg/dL (ref 8.4–10.5)
Chloride: 105 meq/L (ref 96–112)
Creatinine, Ser: 0.71 mg/dL (ref 0.40–1.50)
GFR: 104.03 mL/min (ref >60.00)
Glucose, Bld: 98 mg/dL (ref 70–99)
Potassium: 4.8 meq/L (ref 3.5–5.1)
Sodium: 138 meq/L (ref 135–145)
Total Bilirubin: 0.4 mg/dL (ref 0.2–1.2)
Total Protein: 6.8 g/dL (ref 6.0–8.3)

## 2023-04-27 LAB — CBC WITH DIFFERENTIAL/PLATELET
Basophils Absolute: 0 10*3/uL (ref 0.0–0.1)
Basophils Relative: 0.7 % (ref 0.0–3.0)
Eosinophils Absolute: 0.1 10*3/uL (ref 0.0–0.7)
Eosinophils Relative: 1.4 % (ref 0.0–5.0)
HCT: 45.2 % (ref 39.0–52.0)
Hemoglobin: 15 g/dL (ref 13.0–17.0)
Lymphocytes Relative: 22.7 % (ref 12.0–46.0)
Lymphs Abs: 1.2 10*3/uL (ref 0.7–4.0)
MCHC: 33.2 g/dL (ref 30.0–36.0)
MCV: 96.8 fl (ref 78.0–100.0)
Monocytes Absolute: 0.3 10*3/uL (ref 0.1–1.0)
Monocytes Relative: 6 % (ref 3.0–12.0)
Neutro Abs: 3.6 10*3/uL (ref 1.4–7.7)
Neutrophils Relative %: 69.2 % (ref 43.0–77.0)
Platelets: 248 10*3/uL (ref 150.0–400.0)
RBC: 4.67 Mil/uL (ref 4.22–5.81)
RDW: 13.6 % (ref 11.5–15.5)
WBC: 5.2 10*3/uL (ref 4.0–10.5)

## 2023-04-27 LAB — LIPID PANEL
Cholesterol: 152 mg/dL (ref 0–200)
HDL: 63.3 mg/dL (ref >39.00)
LDL Cholesterol: 81 mg/dL (ref 0–99)
NonHDL: 88.9
Total CHOL/HDL Ratio: 2
Triglycerides: 40 mg/dL (ref 0.0–149.0)
VLDL: 8 mg/dL (ref 0.0–40.0)

## 2023-04-27 LAB — TSH: TSH: 3.65 u[IU]/mL (ref 0.35–5.50)

## 2023-04-27 LAB — PSA: PSA: 0.36 ng/mL (ref 0.10–4.00)

## 2023-04-27 LAB — HEMOGLOBIN A1C: Hgb A1c MFr Bld: 5.7 % (ref 4.6–6.5)

## 2023-04-27 NOTE — Progress Notes (Signed)
 Office Note 04/27/2023  CC:  Chief Complaint  Patient presents with   Annual Exam    HPI:  Patient is a 53 y.o. male who is here for annual health maintenance exam. Feeling well. No significant exercise.  Admits his diet is not the healthiest.  He enjoys deer hunting but did not get to go this year.  No acute concerns.  Past Medical History:  Diagnosis Date   GERD (gastroesophageal reflux disease) 2013   Lanzoprazole helpful   IFG (impaired fasting glucose)    A1c 5.9% 2015; 5.7% 2016.  5.7% 2017. 5.6% 2020., 5.8% 2023   NEOPLASM, SKIN, UNCERTAIN BEHAVIOR 03/30/2007   Hemangioma   Nephrolithiasis    Obesity, Class II, BMI 35-39.9     Past Surgical History:  Procedure Laterality Date   COLONOSCOPY  02/02/2020   2021 hyperplastic polyp->recall 2031   TONSILLECTOMY      Family History  Problem Relation Age of Onset   Diabetes Father    Colon cancer Neg Hx    Colon polyps Neg Hx    Esophageal cancer Neg Hx    Rectal cancer Neg Hx    Stomach cancer Neg Hx     Social History   Socioeconomic History   Marital status: Married    Spouse name: Not on file   Number of children: Not on file   Years of education: Not on file   Highest education level: 12th grade  Occupational History   Not on file  Tobacco Use   Smoking status: Former    Current packs/day: 0.00    Types: Cigarettes    Quit date: 02/16/1986    Years since quitting: 37.2   Smokeless tobacco: Current    Types: Snuff, Chew  Substance and Sexual Activity   Alcohol use: Yes    Comment: social    Drug use: No   Sexual activity: Not on file  Other Topics Concern   Not on file  Social History Narrative   Married, one son and one daughter.   Orig from [place].   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   No exercise but not sedentary.   Quit smoking 25 yrs ago.   +chews tobacco.  Occ alcohol intake.  No hx of drugs or alcohol problems.       Social Drivers of Corporate investment banker Strain: Low Risk  (04/27/2023)   Overall Financial Resource Strain (CARDIA)    Difficulty of Paying Living Expenses: Not very hard  Food Insecurity: No Food Insecurity (04/27/2023)   Hunger Vital Sign    Worried About Running Out of Food in the Last Year: Never true    Ran Out of Food in the Last Year: Never true  Transportation Needs: No Transportation Needs (04/27/2023)   PRAPARE - Administrator, Civil Service (Medical): No    Lack of Transportation (Non-Medical): No  Physical Activity: Insufficiently Active (04/27/2023)   Exercise Vital Sign    Days of Exercise per Week: 3 days    Minutes of Exercise per Session: 10 min  Stress: Stress Concern Present (04/27/2023)   [employer] of Occupational Health - Occupational Stress Questionnaire    Feeling of Stress : To some extent  Social Connections: Moderately Integrated (04/27/2023)   Social Connection and Isolation Panel [NHANES]    Frequency of Communication with Friends and Family: More than three times a week    Frequency of Social  Gatherings with Friends and Family: More than three times a week    Attends Religious Services: 1 to 4 times per year    Active Member of [employer] or Organizations: No    Attends Engineer, structural: Not on file    Marital Status: Married  Catering manager Violence: Not on file    Outpatient Medications Prior to Visit  Medication Sig Dispense Refill   lansoprazole (PREVACID SOLUTAB) 15 MG disintegrating tablet Take 15 mg by mouth as needed.      No facility-administered medications prior to visit.    No Known Allergies  Review of Systems  Constitutional:  Negative for appetite change, chills, fatigue and fever.  HENT:  Negative for congestion, dental problem, ear pain and sore throat.   Eyes:  Negative for discharge, redness and visual disturbance.  Respiratory:  Negative for cough, chest tightness, shortness of breath  and wheezing.   Cardiovascular:  Negative for chest pain, palpitations and leg swelling.  Gastrointestinal:  Negative for abdominal pain, blood in stool, diarrhea, nausea and vomiting.  Genitourinary:  Negative for difficulty urinating, dysuria, flank pain, frequency, hematuria and urgency.  Musculoskeletal:  Negative for arthralgias, back pain, joint swelling, myalgias and neck stiffness.  Skin:  Negative for pallor and rash.  Neurological:  Negative for dizziness, speech difficulty, weakness and headaches.  Hematological:  Negative for adenopathy. Does not bruise/bleed easily.  Psychiatric/Behavioral:  Negative for confusion and sleep disturbance. The patient is not nervous/anxious.     PE;    04/27/2023    9:32 AM 02/04/2022    8:13 AM 01/29/2021    8:06 AM  Vitals with BMI  Height 5' 8.75" 5' 9.5" 5' 8.5"  Weight 235 lbs 13 oz 238 lbs 10 oz 228 lbs 10 oz  BMI 35.09 34.74 34.25  Systolic 120 114 161  Diastolic 80 76 74  Pulse 73 70 64   Gen: Alert, well appearing.  Patient is oriented to person, place, time, and situation. AFFECT: pleasant, lucid thought and speech. ENT: Ears: EACs clear, normal epithelium.  TMs with good light reflex and landmarks bilaterally.  Eyes: no injection, icteris, swelling, or exudate.  EOMI, PERRLA. Nose: no drainage or turbinate edema/swelling.  No injection or focal lesion.  Mouth: lips without lesion/swelling.  Oral mucosa pink and moist.  Dentition intact and without obvious caries or gingival swelling.  Oropharynx without erythema, exudate, or swelling.  Neck: supple/nontender.  No LAD, mass, or TM.  Carotid pulses 2+ bilaterally, without bruits. CV: RRR, no m/r/g.   LUNGS: CTA bilat, nonlabored resps, good aeration in all lung fields. ABD: soft, NT, ND, BS normal.  No hepatospenomegaly or mass.  No bruits. EXT: no clubbing, cyanosis, or edema.  Musculoskeletal: no joint swelling, erythema, warmth, or tenderness.  ROM of all joints intact. Skin -  no sores or suspicious lesions or rashes or color changes  Pertinent labs:  Lab Results  Component Value Date   TSH 3.36 02/04/2022   Lab Results  Component Value Date   WBC 5.0 02/04/2022   HGB 15.1 02/04/2022   HCT 44.3 02/04/2022   MCV 95.1 02/04/2022   PLT 282.0 02/04/2022   Lab Results  Component Value Date   CREATININE 0.78 02/04/2022   BUN 20 02/04/2022   NA 139 02/04/2022   K 4.8 02/04/2022   CL 102 02/04/2022   CO2 28 02/04/2022   Lab Results  Component Value Date   ALT 22 02/04/2022   AST 18 02/04/2022  ALKPHOS 50 02/04/2022   BILITOT 0.6 02/04/2022   Lab Results  Component Value Date   CHOL 167 02/04/2022   Lab Results  Component Value Date   HDL 63.70 02/04/2022   Lab Results  Component Value Date   LDLCALC 90 02/04/2022   Lab Results  Component Value Date   TRIG 65.0 02/04/2022   Lab Results  Component Value Date   CHOLHDL 3 02/04/2022   Lab Results  Component Value Date   PSA 0.34 02/04/2022   PSA 0.28 01/29/2021   PSA 0.27 10/30/2019   Lab Results  Component Value Date   HGBA1C 5.8 02/04/2022   ASSESSMENT AND PLAN:   Health maintenance exam: Reviewed age and gender appropriate health maintenance issues (prudent diet, regular exercise, health risks of tobacco and excessive alcohol, use of seatbelts, fire alarms in home, use of sunscreen).  Also reviewed age and gender appropriate health screening as well as vaccine recommendations. Vaccines: Shingirx->declined. Otherwise UTD. Labs: Fasting health panel, Hba1c (IFG) Prostate ca screening: PSA today Colon ca screening: recall 2031  An After Visit Summary was printed and given to the patient.  FOLLOW UP:  Return in about 1 year (around 04/26/2024) for annual CPE (fasting).  Signed:  Santiago Bumpers, MD           04/27/2023

## 2023-04-28 ENCOUNTER — Encounter: Payer: Self-pay | Admitting: Family Medicine

## 2023-07-13 ENCOUNTER — Encounter: Payer: Self-pay | Admitting: Family Medicine

## 2023-07-13 ENCOUNTER — Ambulatory Visit: Payer: Self-pay

## 2023-07-13 ENCOUNTER — Ambulatory Visit (INDEPENDENT_AMBULATORY_CARE_PROVIDER_SITE_OTHER): Admitting: Family Medicine

## 2023-07-13 VITALS — BP 118/80 | HR 97 | Temp 98.9°F | Wt 232.0 lb

## 2023-07-13 DIAGNOSIS — R509 Fever, unspecified: Secondary | ICD-10-CM

## 2023-07-13 DIAGNOSIS — R350 Frequency of micturition: Secondary | ICD-10-CM | POA: Diagnosis not present

## 2023-07-13 LAB — POC COVID19 BINAXNOW: SARS Coronavirus 2 Ag: NEGATIVE

## 2023-07-13 MED ORDER — AMOXICILLIN 875 MG PO TABS: 875.0000 mg | ORAL_TABLET | Freq: Two times a day (BID) | ORAL | 0 refills | Status: AC

## 2023-07-13 NOTE — Telephone Encounter (Signed)
  Chief Complaint: fever Symptoms: fever up to 101.5, nasal congestion Frequency: Sunday  Pertinent Negatives: NA Disposition: []ED /[]Urgent Care (no appt availability in office) / [x]Appointment(In office/virtual)/ [] [facility]/ []Home Care/ []Refused Recommended Disposition /[][facility]/ [] Follow-up with PCP Additional Notes: pt has been taking Tylenol and Advil for sx, effective but still has fever on and off. Scheduled appt today at 100. Care advice given and pt verbalized understanding.   Copied from CRM #[id number]. Topic: Clinical - Pink Word Triage >> Jul 13, 2023  8:06 AM Ashley D wrote: Reason for Triage: patient wife called stating the patient is running a fever of 100.5 over three days no other symptoms CB [phone] or [phone] Reason for Disposition  Fever present > 3 days (72 hours)  Answer Assessment - Initial Assessment Questions 1. TEMPERATURE: "What is the most recent temperature?"  "How was it measured?"      10 1.5 2. ONSET: "When did the fever start?"      Sunday  3. CHILLS: "Do you have chills?" If yes: "How bad are they?"  (e.g., none, mild, moderate, severe)   - NONE: no chills   - MILD: feeling cold   - MODERATE: feeling very cold, some shivering (feels better under a thick blanket)   - SEVERE: feeling extremely cold with shaking chills (general body shaking, rigors; even under a thick blanket)      Mild  4. OTHER SYMPTOMS: "Do you have any other symptoms besides the fever?"  (e.g., abdomen pain, cough, diarrhea, earache, headache, sore throat, urination pain)     Some head congestion  7. TREATMENT: "What have you done so far to treat this fever?" (e.g., medications)     Tylenol  8. IMMUNOCOMPROMISE: "Do you have of the following: diabetes, HIV positive, splenectomy, cancer chemotherapy, chronic steroid treatment, transplant patient, etc."     no  Protocols used: Fever-A-AH

## 2023-07-13 NOTE — Progress Notes (Signed)
 Aaron Russell , 04-Dec-1970, 53 y.o., male MRN: [id number] Patient Care Team    Relationship Specialty Notifications Start End  McGowen, Minetta Aly, MD PCP - General Family Medicine  07/31/11   Pyrtle, Amber Bail, MD Consulting Physician Gastroenterology  01/29/21     Chief Complaint  Patient presents with   Nasal Congestion    Since Sunday; nasal/head congestion, fever, fatigue. Pt has taken Advil.      Subjective: Aaron Russell is a 53 y.o. Pt presents for an OV with complaints of fever, fatigue, nasal and head congestion  of 3 days duration.  Associated symptoms include chills. 100.97F t max Pt has tried advil to ease their symptoms.   (See ros)     04/27/2023    9:32 AM 02/04/2022    8:14 AM 01/29/2021    8:06 AM 10/30/2019    8:26 AM 09/15/2018   12:59 PM  Depression screen PHQ 2/9  Decreased Interest 0 0 0 0 0  Down, Depressed, Hopeless 0 0 0 0 0  PHQ - 2 Score 0 0 0 0 0    No Known Allergies Social History   Social History Narrative   Married, one son and one daughter.   Orig from [place].   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  Mows yards on the side.   No exercise but not sedentary.   Quit smoking 25 yrs ago.   +chews tobacco.  Occ alcohol intake.  No hx of drugs or alcohol problems.      Past Medical History:  Diagnosis Date   GERD (gastroesophageal reflux disease) 2013   Lanzoprazole helpful   IFG (impaired fasting glucose)    A1c 5.9% 2015; 5.7% 2016.  5.7% 2017. 5.6% 2020., 5.8% 2023   NEOPLASM, SKIN, UNCERTAIN BEHAVIOR 03/30/2007   Hemangioma   Nephrolithiasis    Obesity, Class II, BMI 35-39.9    Past Surgical History:  Procedure Laterality Date   COLONOSCOPY  02/02/2020   2021 hyperplastic polyp->recall 2031   TONSILLECTOMY     Family History  Problem Relation Age of Onset   Diabetes Father    Colon cancer Neg Hx    Colon polyps Neg Hx    Esophageal cancer Neg Hx    Rectal cancer Neg Hx     Stomach cancer Neg Hx    Allergies as of 07/13/2023   No Known Allergies      Medication List        Accurate as of Jul 13, 2023  1:26 PM. If you have any questions, ask your nurse or doctor.          amoxicillin 875 MG tablet Commonly known as: AMOXIL Take 1 tablet (875 mg total) by mouth 2 (two) times daily for 10 days. Started by: Vearl Allbaugh   lansoprazole 15 MG disintegrating tablet Commonly known as: PREVACID SOLUTAB Take 15 mg by mouth as needed.        All past medical history, surgical history, allergies, family history, immunizations andmedications were updated in the EMR today and reviewed under the history and medication portions of their EMR.     Review of Systems  Constitutional:  Positive for chills, fever and malaise/fatigue.  HENT:  Positive for congestion and sinus pain. Negative for ear pain and sore throat.   Eyes: Negative.   Respiratory:  Negative for cough, sputum production, shortness of breath and wheezing.   Cardiovascular: Negative.   Gastrointestinal: Negative.  Musculoskeletal:  Negative for myalgias.  Skin:  Negative for rash.  Neurological:  Positive for headaches. Negative for dizziness.   Negative, with the exception of above mentioned in HPI   Objective:  BP 118/80   Pulse 97   Temp 98.9 F (37.2 C)   Wt 232 lb (105.2 kg)   SpO2 98%   BMI 34.51 kg/m  Body mass index is 34.51 kg/m. Physical Exam Vitals and nursing note reviewed. Exam conducted with a chaperone present.  Constitutional:      General: He is not in acute distress.    Appearance: Normal appearance. He is not ill-appearing, toxic-appearing or diaphoretic.  HENT:     Head: Normocephalic and atraumatic.     Right Ear: Tympanic membrane and ear canal normal.     Left Ear: Tympanic membrane and ear canal normal.     Nose: Congestion and rhinorrhea present.     Mouth/Throat:     Mouth: Mucous membranes are moist.     Pharynx: No oropharyngeal exudate or  posterior oropharyngeal erythema.  Eyes:     General: No scleral icterus.       Right eye: No discharge.        Left eye: No discharge.     Extraocular Movements: Extraocular movements intact.     Pupils: Pupils are equal, round, and reactive to light.  Cardiovascular:     Rate and Rhythm: Normal rate and regular rhythm.  Pulmonary:     Effort: Pulmonary effort is normal. No respiratory distress.     Breath sounds: Normal breath sounds. No wheezing, rhonchi or rales.  Musculoskeletal:     Cervical back: Neck supple.     Right lower leg: No edema.     Left lower leg: No edema.  Lymphadenopathy:     Cervical: No cervical adenopathy.  Skin:    General: Skin is warm and dry.     Coloration: Skin is not jaundiced or pale.     Findings: No rash.  Neurological:     Mental Status: He is alert and oriented to person, place, and time. Mental status is at baseline.  Psychiatric:        Mood and Affect: Mood normal.        Behavior: Behavior normal.        Thought Content: Thought content normal.        Judgment: Judgment normal.     No results found. No results found. Results for orders placed or performed in visit on 07/13/23 (from the past 24 hours)  POC COVID-19 BinaxNow     Status: Normal   Collection Time: 07/13/23  1:21 PM  Result Value Ref Range   SARS Coronavirus 2 Ag Negative Negative    Assessment/Plan: Aaron Russell is a 53 y.o. male present for OV for  Fever, unspecified fever cause (Primary) - POC COVID-19 BinaxNow> negative Rest, hydrate.  +/- flonase, mucinex (DM if cough), nettie pot or nasal saline.  Amox 875 bid prescribed, take until completed. If started. Wait 48 hours, if symptoms continue to improve, this is viral> no abx needed. If sx worsen or do not continue to improve> start abx.  F/U 2 weeks if not improved.   Urinary frequency Urinalysis with reflex cx sent  Reviewed expectations re: course of current medical issues. Discussed self-management  of symptoms. Outlined signs and symptoms indicating need for more acute intervention. Patient verbalized understanding and all questions were answered. Patient received an After-Visit Summary.  Orders Placed This Encounter  Procedures   Urinalysis w microscopic + reflex cultur   POC COVID-19 BinaxNow   Meds ordered this encounter  Medications   amoxicillin  (AMOXIL ) 875 MG tablet    Sig: Take 1 tablet (875 mg total) by mouth 2 (two) times daily for 10 days.    Dispense:  20 tablet    Refill:  0   Referral Orders  No referral(s) requested today     Note is dictated utilizing voice recognition software. Although note has been proof read prior to signing, occasional typographical errors still can be missed. If any questions arise, please do not hesitate to call for verification.   electronically signed by:  Napolean Backbone, DO  [facility] [place]

## 2023-07-13 NOTE — Telephone Encounter (Signed)
No further action needed. Pt scheduled.

## 2023-07-13 NOTE — Patient Instructions (Addendum)
 Return in about 2 weeks (around 07/27/2023), or if symptoms worsen or fail to improve, for w/PCP.        Great to see you today.  I have refilled the medication(s) we provide.   If labs were collected or images ordered, we will inform you of  results once we have received them and reviewed. We will contact you either by echart message, or telephone call.  Please give ample time to the testing facility, and our office to run,  receive and review results. Please do not call inquiring of results, even if you can see them in your chart. We will contact you as soon as we are able. If it has been over 1 week since the test was completed, and you have not yet heard from us , then please call us .    - echart message- for normal results that have been seen by the patient already.   - telephone call: abnormal results or if patient has not viewed results in their echart.  If a referral to a specialist was entered for you, please call us  in 2 weeks if you have not heard from the specialist office to schedule.

## 2023-07-14 ENCOUNTER — Ambulatory Visit: Payer: Self-pay | Admitting: Family Medicine

## 2023-07-14 NOTE — Telephone Encounter (Signed)
 Please call patient His urine microanalysis does appear infectious.  Urine culture will not be back until tomorrow Friday. I do recommend he go ahead and start Augmentin, will await on urine culture.

## 2023-07-14 NOTE — Telephone Encounter (Signed)
 Copied from CRM [id number]. Topic: Clinical - Lab/Test Results >> Jul 14, 2023  2:20 PM Albertha Alosa wrote: Reason for CRM: Patient called in regarding missed call, relay lab results to patient patient stated he understood and had no further questions

## 2023-07-16 LAB — URINE CULTURE
MICRO NUMBER:: 16508113
SPECIMEN QUALITY:: ADEQUATE

## 2023-07-16 LAB — URINALYSIS W MICROSCOPIC + REFLEX CULTURE
Bilirubin Urine: NEGATIVE
Glucose, UA: NEGATIVE
Hyaline Cast: NONE SEEN /LPF
Nitrites, Initial: NEGATIVE
RBC / HPF: NONE SEEN /HPF (ref 0–2)
Specific Gravity, Urine: 1.013 (ref 1.001–1.035)
Squamous Epithelial / HPF: NONE SEEN /HPF (ref <=5)
WBC, UA: >=60 /HPF — AB (ref 0–5)
pH: 6 (ref 5.0–8.0)

## 2023-07-16 LAB — CULTURE INDICATED

## 2023-07-16 NOTE — Telephone Encounter (Signed)
 Yes.  He Amoxil and Augmentin are both Amoxil based

## 2023-07-16 NOTE — Telephone Encounter (Signed)
 Please call patient His urinalysis is positive for a E. coli UTI.  The Augmentin prescribed would treat if he was having the sinus symptoms causing the fever or the UTI causing the fever.  Make sure to take the antibiotic as prescribed and complete full course.

## 2024-03-09 ENCOUNTER — Ambulatory Visit: Admitting: Sports Medicine

## 2024-03-09 VITALS — BP 130/84 | HR 91 | Temp 98.1°F | Wt 232.8 lb

## 2024-03-09 DIAGNOSIS — R053 Chronic cough: Secondary | ICD-10-CM | POA: Diagnosis not present

## 2024-03-09 DIAGNOSIS — K219 Gastro-esophageal reflux disease without esophagitis: Secondary | ICD-10-CM | POA: Diagnosis not present

## 2024-03-09 DIAGNOSIS — R0981 Nasal congestion: Secondary | ICD-10-CM | POA: Diagnosis not present

## 2024-03-09 MED ORDER — FLUTICASONE PROPIONATE 50 MCG/ACT NA SUSP: 2.0000 | Freq: Every day | NASAL | 6 refills | Status: AC

## 2024-03-09 MED ORDER — BENZONATATE 200 MG PO CAPS: 200.0000 mg | ORAL_CAPSULE | Freq: Two times a day (BID) | ORAL | 0 refills | Status: AC | PRN

## 2024-03-09 MED ORDER — LORATADINE 10 MG PO TABS: 10.0000 mg | ORAL_TABLET | Freq: Every day | ORAL | 11 refills | Status: AC

## 2024-03-09 NOTE — Progress Notes (Signed)
 "  Careteam: Patient Care Team: Candise Aleene DEL, MD as PCP - General (Family Medicine) Pyrtle, Gordy HERO, MD as Consulting Physician (Gastroenterology)  Allergies[1]  Chief Complaint  Patient presents with   Cough    Pt has been having a cough, nasal and chest congestion for over a month now.     Discussed the use of AI scribe software for clinical note transcription with the patient, who gave verbal consent to proceed.  History of Present Illness  Deano D Munoz Dwayne is a 54 year old male who presents with a persistent sinus pain and cough.  He has been experiencing a sensation of a sinus pain that initially started in the sinus area and then moved to his throat for about a month.  He experiences a persistent cough, which is mostly dry but occasionally produces a small amount of phlegm. The cough was particularly severe last night, and he frequently feels the need to clear his throat. No fever, chills, or ear pain are present. He reports nasal congestion with occasional dripping and sometimes feels stuffed up. He denies significant headaches, though he occasionally experiences mild ones. No heartburn or metallic taste when lying down.  He takes reflux medication as needed, depending on his diet, and sometimes skips it for a few days. For his symptoms, he has used Nyquil and cough drops but has not tried other over-the-counter medications for congestion or drainage.  He denies a history of asthma, smoking, or known allergies. He reports having a chill last night but no fever upon checking his temperature. His appetite remains good, and he has not experienced any weight loss. He walks daily but does not engage in other forms of exercise.    Review of Systems:  Review of Systems  Constitutional:  Negative for chills and fever.  HENT:  Positive for congestion. Negative for ear pain, sinus pain and sore throat.   Eyes:  Negative for blurred vision.  Respiratory:  Positive for cough.  Negative for sputum production and shortness of breath.   Cardiovascular:  Negative for chest pain, palpitations and leg swelling.  Gastrointestinal:  Negative for abdominal pain, heartburn and nausea.  Genitourinary:  Negative for dysuria, frequency and hematuria.  Musculoskeletal:  Negative for falls and myalgias.  Neurological:  Negative for dizziness, sensory change and focal weakness.   Negative unless indicated in HPI.   Patient Active Problem List   Diagnosis Date Noted   Obesity (BMI 30-39.9) 09/15/2018   Impaired fasting glucose 02/08/2014   Epidermal inclusion cyst 02/08/2014   Health maintenance examination 08/21/2011   Past Medical History:  Diagnosis Date   GERD (gastroesophageal reflux disease) 2013   Lanzoprazole helpful   IFG (impaired fasting glucose)    A1c 5.9% 2015; 5.7% 2016.  5.7% 2017. 5.6% 2020., 5.8% 2023   NEOPLASM, SKIN, UNCERTAIN BEHAVIOR 03/30/2007   Hemangioma   Nephrolithiasis    Obesity, Class II, BMI 35-39.9    Past Surgical History:  Procedure Laterality Date   COLONOSCOPY  02/02/2020   2021 hyperplastic polyp->recall 2031   TONSILLECTOMY     Social History[2] Family History  Problem Relation Age of Onset   Diabetes Father    Colon cancer Neg Hx    Colon polyps Neg Hx    Esophageal cancer Neg Hx    Rectal cancer Neg Hx    Stomach cancer Neg Hx    Allergies[3]  Medications: Patient's Medications  New Prescriptions   No medications on file  Previous Medications  LANSOPRAZOLE (PREVACID SOLUTAB) 15 MG DISINTEGRATING TABLET    Take 15 mg by mouth as needed.   Modified Medications   No medications on file  Discontinued Medications   No medications on file    Physical Exam: Vitals:   03/09/24 1143  BP: 130/84  Pulse: 91  Temp: 98.1 F (36.7 C)  TempSrc: Oral  SpO2: 97%  Weight: 232 lb 12.8 oz (105.6 kg)   Body mass index is 34.63 kg/m. BP Readings from Last 3 Encounters:  03/09/24 130/84  07/13/23 118/80  04/27/23  120/80   Wt Readings from Last 3 Encounters:  03/09/24 232 lb 12.8 oz (105.6 kg)  07/13/23 232 lb (105.2 kg)  04/27/23 235 lb 12.8 oz (107 kg)    Physical Exam Constitutional:      Appearance: Normal appearance.  HENT:     Head: Normocephalic and atraumatic.     Right Ear: Tympanic membrane normal.     Left Ear: Tympanic membrane normal.     Mouth/Throat:     Pharynx: No oropharyngeal exudate or posterior oropharyngeal erythema.     Comments: No sinus tenderness Cardiovascular:     Rate and Rhythm: Normal rate and regular rhythm.     Pulses: Normal pulses.     Heart sounds: Normal heart sounds.  Pulmonary:     Effort: No respiratory distress.     Breath sounds: No stridor. No wheezing or rales.  Abdominal:     General: Bowel sounds are normal. There is no distension.     Palpations: Abdomen is soft.     Tenderness: There is no abdominal tenderness. There is no right CVA tenderness or guarding.  Musculoskeletal:        General: No swelling.  Lymphadenopathy:     Cervical: No cervical adenopathy.  Neurological:     Mental Status: He is alert. Mental status is at baseline.     Sensory: No sensory deficit.     Motor: No weakness.     Labs reviewed: Basic Metabolic Panel: Recent Labs    04/27/23 0955  NA 138  K 4.8  CL 105  CO2 27  GLUCOSE 98  BUN 18  CREATININE 0.71  CALCIUM 9.3  TSH 3.65   Liver Function Tests: Recent Labs    04/27/23 0955  AST 15  ALT 15  ALKPHOS 48  BILITOT 0.4  PROT 6.8  ALBUMIN 4.6   No results for input(s): LIPASE, AMYLASE in the last 8760 hours. No results for input(s): AMMONIA in the last 8760 hours. CBC: Recent Labs    04/27/23 0955  WBC 5.2  NEUTROABS 3.6  HGB 15.0  HCT 45.2  MCV 96.8  PLT 248.0   Lipid Panel: Recent Labs    04/27/23 0955  CHOL 152  HDL 63.30  LDLCALC 81  TRIG 40.0  CHOLHDL 2   TSH: Recent Labs    04/27/23 0955  TSH 3.65   A1C: Lab Results  Component Value Date   HGBA1C 5.7  04/27/2023    Assessment & Plan Chronic cough Lungs clear  Afebrile 02 sat 97% Will send  tessalon      Nasal congestion No sinus tenderness Take flonase , claritin      Gastroesophageal reflux disease, unspecified whether esophagitis present Denies bloody or dark stools Cont with lansoprazole     No follow-ups on file.:   Tanikka Bresnan     [1] No Known Allergies [2]  Social History Tobacco Use   Smoking status: Former    Current packs/day:  0.00    Types: Cigarettes    Quit date: 02/16/1986    Years since quitting: 38.0   Smokeless tobacco: Current    Types: Snuff, Chew  Substance Use Topics   Alcohol use: Yes    Comment: social    Drug use: No  [3] No Known Allergies  "
